# Patient Record
Sex: Female | Born: 1980
Health system: Southern US, Community
[De-identification: ages and names within clinical notes are randomized; demographics above are authoritative.]

## PROBLEM LIST (undated history)

## (undated) DIAGNOSIS — E079 Disorder of thyroid, unspecified: Secondary | ICD-10-CM

## (undated) HISTORY — PX: NO PAST SURGERIES: SHX2092

---

## 2013-02-23 ENCOUNTER — Emergency Department: Payer: Self-pay | Admitting: Emergency Medicine

## 2013-02-23 LAB — LIPASE, BLOOD: Lipase: 195 U/L (ref 73–393)

## 2013-02-23 LAB — COMPREHENSIVE METABOLIC PANEL
Albumin: 4 g/dL (ref 3.4–5.0)
Alkaline Phosphatase: 96 U/L (ref 50–136)
BUN: 12 mg/dL (ref 7–18)
Bilirubin,Total: 0.2 mg/dL (ref 0.2–1.0)
Co2: 26 mmol/L (ref 21–32)
EGFR (African American): >60
EGFR (Non-African Amer.): >60
Glucose: 110 mg/dL — ABNORMAL HIGH (ref 65–99)
Osmolality: 278 (ref 275–301)
Potassium: 3.7 mmol/L (ref 3.5–5.1)
SGOT(AST): 22 U/L (ref 15–37)
Sodium: 139 mmol/L (ref 136–145)
Total Protein: 7.4 g/dL (ref 6.4–8.2)

## 2013-02-23 LAB — URINALYSIS, COMPLETE
Bilirubin,UR: NEGATIVE
Glucose,UR: NEGATIVE mg/dL (ref 0–75)
Ketone: NEGATIVE
Nitrite: NEGATIVE
Ph: 7 (ref 4.5–8.0)
RBC,UR: 5 /HPF (ref 0–5)
Specific Gravity: 1.018 (ref 1.003–1.030)
Squamous Epithelial: 3

## 2013-02-23 LAB — CBC
HGB: 13.1 g/dL (ref 12.0–16.0)
Platelet: 282 10*3/uL (ref 150–440)
RBC: 4.56 10*6/uL (ref 3.80–5.20)
RDW: 13.6 % (ref 11.5–14.5)

## 2013-02-24 ENCOUNTER — Emergency Department: Payer: Self-pay | Admitting: Emergency Medicine

## 2013-05-13 ENCOUNTER — Ambulatory Visit: Payer: Self-pay | Admitting: Family Medicine

## 2013-05-13 LAB — CBC WITH DIFFERENTIAL/PLATELET
Basophil #: 0.1 10*3/uL (ref 0.0–0.1)
Basophil %: 0.7 %
Eosinophil %: 0.1 %
HGB: 13.8 g/dL (ref 12.0–16.0)
MCH: 27.9 pg (ref 26.0–34.0)
MCHC: 32.3 g/dL (ref 32.0–36.0)
MCV: 86 fL (ref 80–100)
Monocyte #: 0.7 x10 3/mm (ref 0.2–0.9)
Monocyte %: 4.5 %
Platelet: 278 10*3/uL (ref 150–440)
RBC: 4.93 10*6/uL (ref 3.80–5.20)
RDW: 13 % (ref 11.5–14.5)
WBC: 14.5 10*3/uL — ABNORMAL HIGH (ref 3.6–11.0)

## 2013-05-13 LAB — URINALYSIS, COMPLETE
Leukocyte Esterase: NEGATIVE
Nitrite: NEGATIVE
Specific Gravity: >=1.03 (ref 1.003–1.030)
Squamous Epithelial: >30

## 2013-07-09 ENCOUNTER — Emergency Department: Payer: Self-pay | Admitting: Emergency Medicine

## 2014-06-07 ENCOUNTER — Ambulatory Visit: Payer: Self-pay | Admitting: Physician Assistant

## 2014-06-08 ENCOUNTER — Emergency Department: Payer: Self-pay | Admitting: Emergency Medicine

## 2014-08-14 ENCOUNTER — Emergency Department: Payer: Self-pay | Admitting: Emergency Medicine

## 2014-11-03 ENCOUNTER — Encounter: Payer: Self-pay | Admitting: *Deleted

## 2014-11-03 DIAGNOSIS — Z3202 Encounter for pregnancy test, result negative: Secondary | ICD-10-CM | POA: Diagnosis not present

## 2014-11-03 DIAGNOSIS — N938 Other specified abnormal uterine and vaginal bleeding: Secondary | ICD-10-CM | POA: Diagnosis not present

## 2014-11-03 DIAGNOSIS — N939 Abnormal uterine and vaginal bleeding, unspecified: Secondary | ICD-10-CM | POA: Diagnosis present

## 2014-11-03 LAB — CBC
HCT: 41.4 % (ref 35.0–47.0)
Hemoglobin: 13.9 g/dL (ref 12.0–16.0)
MCH: 28.6 pg (ref 26.0–34.0)
MCHC: 33.5 g/dL (ref 32.0–36.0)
MCV: 85.3 fL (ref 80.0–100.0)
Platelets: 320 10*3/uL (ref 150–440)
RBC: 4.86 MIL/uL (ref 3.80–5.20)
RDW: 13.1 % (ref 11.5–14.5)
WBC: 11.4 10*3/uL — ABNORMAL HIGH (ref 3.6–11.0)

## 2014-11-03 LAB — POCT PREGNANCY, URINE: Preg Test, Ur: NEGATIVE

## 2014-11-03 NOTE — ED Notes (Addendum)
Pt reports onset of vaginal pressure and vaginal bleeding that started on 1 week ago this past Sunday.

## 2014-11-04 ENCOUNTER — Emergency Department
Admission: EM | Admit: 2014-11-04 | Discharge: 2014-11-04 | Disposition: A | Discharge status: Home / Self Care | Payer: Managed Care, Other (non HMO) | Attending: Emergency Medicine | Admitting: Emergency Medicine

## 2014-11-04 ENCOUNTER — Emergency Department: Payer: Managed Care, Other (non HMO)

## 2014-11-04 DIAGNOSIS — Z8742 Personal history of other diseases of the female genital tract: Secondary | ICD-10-CM

## 2014-11-04 DIAGNOSIS — N938 Other specified abnormal uterine and vaginal bleeding: Secondary | ICD-10-CM

## 2014-11-04 HISTORY — DX: Disorder of thyroid, unspecified: E07.9

## 2014-11-04 NOTE — ED Provider Notes (Signed)
[facility] Emergency Department Provider Note  ____________________________________________  Time seen: 2:15 AM  I have reviewed the triage vital signs and the nursing notes.   HISTORY  Chief Complaint Vaginal Bleeding      HPI Maria CavalierGidget Mckay MansFaye Mckay is a 34 y.o. female presents with vaginal bleeding 1 week with vaginal pressure. Last menstrual period 10/10/14. Patient admits to previous history of the social uterine bleeding however states that menses have been regular since therapeutic on Synthroid.     Past Medical History  Diagnosis Date  . Thyroid disease     There are no active problems to display for this patient.   History reviewed. No pertinent past surgical history.  No current outpatient prescriptions on file.  Allergies Sulfa antibiotics  No family history on file.  Social History History  Substance Use Topics  . Smoking status: Never Smoker   . Smokeless tobacco: Not on file  . Alcohol Use: No    Review of Systems  Constitutional: Negative for fever. Eyes: Negative for visual changes. ENT: Negative for sore throat. Cardiovascular: Negative for chest pain. Respiratory: Negative for shortness of breath. Gastrointestinal: Negative for abdominal pain, vomiting and diarrhea. Genitourinary: Negative for dysuria. Positive for vaginal bleeding Musculoskeletal: Negative for back pain. Skin: Negative for rash. Neurological: Negative for headaches, focal weakness or numbness.  10-point ROS otherwise negative.  ____________________________________________   PHYSICAL EXAM:  VITAL SIGNS: ED Triage Vitals  Enc Vitals Group     BP 11/03/14 2202 127/79 mmHg     Pulse Rate 11/03/14 2202 96     Resp 11/03/14 2202 20     Temp 11/03/14 2202 98.4 F (36.9 C)     Temp Source 11/03/14 2202 Oral     SpO2 11/03/14 2202 100 %     Weight 11/03/14 2202 157 lb (71.215 kg)     Height 11/03/14 2202 5\' 3"  (1.6 m)     Head Cir --    Peak Flow --      Pain Score 11/03/14 2202 9     Pain Loc --      Pain Edu? --      Excl. in GC? --     Constitutional: Alert and oriented. Well appearing and in no distress. Eyes: Conjunctivae are normal. PERRL. Normal extraocular movements. ENT   Head: Normocephalic and atraumatic.   Nose: No congestion/rhinnorhea.   Mouth/Throat: Mucous membranes are moist.   Neck: No stridor. Hematological/Lymphatic/Immunilogical: No cervical lymphadenopathy. Cardiovascular: Normal rate, regular rhythm. Normal and symmetric distal pulses are present in all extremities. No murmurs, rubs, or gallops. Respiratory: Normal respiratory effort without tachypnea nor retractions. Breath sounds are clear and equal bilaterally. No wheezes/rales/rhonchi. Gastrointestinal: Suprapubic discomfort. No distention. There is no CVA tenderness. Genitourinary: deferred Musculoskeletal: Nontender with normal range of motion in all extremities. No joint effusions.  No lower extremity tenderness nor edema. Neurologic:  Normal speech and language. No gross focal neurologic deficits are appreciated. Speech is normal.  Skin:  Skin is warm, dry and intact. No rash noted. Psychiatric: Mood and affect are normal. Speech and behavior are normal. Patient exhibits appropriate insight and judgment.  ____________________________________________    LABS (pertinent positives/negatives)  Labs Reviewed  CBC - Abnormal; Notable for the following:    WBC 11.4 (*)    All other components within normal limits  POC URINE PREG, ED  POCT PREGNANCY, URINE     ____________________________________________     RADIOLOGY unremarkable pelvic ultrasound  ____________________________________________  INITIAL IMPRESSION / ASSESSMENT AND PLAN / ED COURSE  Pertinent labs & imaging results that were available during my care of the patient were reviewed by me and considered in my medical decision making (see chart for  details).  History of physical exam consistent with dysfunctional uterine bleeding.  ____________________________________________   FINAL CLINICAL IMPRESSION(S) / ED DIAGNOSES  Final diagnoses:  DUB (dysfunctional uterine bleeding)      Darci Currentandolph N Karyn Brull, MD 11/04/14 0430

## 2014-11-04 NOTE — ED Notes (Signed)
Patient transported to ultrasound.

## 2014-11-04 NOTE — Discharge Instructions (Signed)
Dysfunctional Uterine Bleeding Normally, menstrual periods begin between ages 11 to 17 in young women. A normal menstrual cycle/period may begin every 23 days up to 35 days and lasts from 1 to 7 days. Around 12 to 14 days before your menstrual period starts, ovulation (ovary produces an egg) occurs. When counting the time between menstrual periods, count from the first day of bleeding of the previous period to the first day of bleeding of the next period. Dysfunctional (abnormal) uterine bleeding is bleeding that is different from a normal menstrual period. Your periods may come earlier or later than usual. They may be lighter, have blood clots or be heavier. You may have bleeding between periods, or you may skip one period or more. You may have bleeding after sexual intercourse, bleeding after menopause, or no menstrual period. CAUSES   Pregnancy (normal, miscarriage, tubal).  IUDs (intrauterine device, birth control).  Birth control pills.  Hormone treatment.  Menopause.  Infection of the cervix.  Blood clotting problems.  Infection of the inside lining of the uterus.  Endometriosis, inside lining of the uterus growing in the pelvis and other female organs.  Adhesions (scar tissue) inside the uterus.  Obesity or severe weight loss.  Uterine polyps inside the uterus.  Cancer of the vagina, cervix, or uterus.  Ovarian cysts or polycystic ovary syndrome.  Medical problems (diabetes, thyroid disease).  Uterine fibroids (noncancerous tumor).  Problems with your female hormones.  Endometrial hyperplasia, very thick lining and enlarged cells inside of the uterus.  Medicines that interfere with ovulation.  Radiation to the pelvis or abdomen.  Chemotherapy. DIAGNOSIS   Your doctor will discuss the history of your menstrual periods, medicines you are taking, changes in your weight, stress in your life, and any medical problems you may have.  Your doctor will do a physical  and pelvic examination.  Your doctor may want to perform certain tests to make a diagnosis, such as:  Pap test.  Blood tests.  Cultures for infection.  CT scan.  Ultrasound.  Hysteroscopy.  Laparoscopy.  MRI.  Hysterosalpingography.  D and C.  Endometrial biopsy. TREATMENT  Treatment will depend on the cause of the dysfunctional uterine bleeding (DUB). Treatment may include:  Observing your menstrual periods for a couple of months.  Prescribing medicines for medical problems, including:  Antibiotics.  Hormones.  Birth control pills.  Removing an IUD (intrauterine device, birth control).  Surgery:  D and C (scrape and remove tissue from inside the uterus).  Laparoscopy (examine inside the abdomen with a lighted tube).  Uterine ablation (destroy lining of the uterus with electrical current, laser, heat, or freezing).  Hysteroscopy (examine cervix and uterus with a lighted tube).  Hysterectomy (remove the uterus). HOME CARE INSTRUCTIONS   If medicines were prescribed, take exactly as directed. Do not change or switch medicines without consulting your caregiver.  Long term heavy bleeding may result in iron deficiency. Your caregiver may have prescribed iron pills. They help replace the iron that your body lost from heavy bleeding. Take exactly as directed.  Do not take aspirin or medicines that contain aspirin one week before or during your menstrual period. Aspirin may make the bleeding worse.  If you need to change your sanitary pad or tampon more than once every 2 hours, stay in bed with your feet elevated and a cold pack on your lower abdomen. Rest as much as possible, until the bleeding stops or slows down.  Eat well-balanced meals. Eat foods high in iron. Examples   are:  Leafy green vegetables.  Whole-grain breads and cereals.  Eggs.  Meat.  Liver.  Do not try to lose weight until the abnormal bleeding has stopped and your blood iron level is  back to normal. Do not lift more than ten pounds or do strenuous activities when you are bleeding.  For a couple of months, make note on your calendar, marking the start and ending of your period, and the type of bleeding (light, medium, heavy, spotting, clots or missed periods). This is for your caregiver to better evaluate your problem. SEEK MEDICAL CARE IF:   You develop nausea (feeling sick to your stomach) and vomiting, dizziness, or diarrhea while you are taking your medicine.  You are getting lightheaded or weak.  You have any problems that may be related to the medicine you are taking.  You develop pain with your DUB.  You want to remove your IUD.  You want to stop or change your birth control pills or hormones.  You have any type of abnormal bleeding mentioned above.  You are over 16 years old and have not had a menstrual period yet.  You are 34 years old and you are still having menstrual periods.  You have any of the symptoms mentioned above.  You develop a rash. SEEK IMMEDIATE MEDICAL CARE IF:   An oral temperature above 102 F (38.9 C) develops.  You develop chills.  You are changing your sanitary pad or tampon more than once an hour.  You develop abdominal pain.  You pass out or faint. Document Released: 06/08/2000 Document Revised: 09/03/2011 Document Reviewed: 05/10/2009 Exit[employer] Patient Information 2015 [employer]. This information is not intended to replace advice given to you by your health care provider. Make sure you discuss any questions you have with your health care provider.  

## 2014-11-04 NOTE — ED Notes (Signed)
Patient returned from Ultrasound. 

## 2014-11-04 NOTE — ED Notes (Signed)
MD Brown at bedside.

## 2014-12-13 ENCOUNTER — Ambulatory Visit
Admission: EM | Admit: 2014-12-13 | Discharge: 2014-12-13 | Disposition: A | Discharge status: Home / Self Care | Payer: Managed Care, Other (non HMO) | Attending: Family Medicine | Admitting: Family Medicine

## 2014-12-13 DIAGNOSIS — T148 Other injury of unspecified body region: Secondary | ICD-10-CM | POA: Diagnosis not present

## 2014-12-13 DIAGNOSIS — T148XXA Other injury of unspecified body region, initial encounter: Secondary | ICD-10-CM

## 2014-12-13 NOTE — ED Notes (Signed)
Patient had blood work done at [facility] today, noticed swelling and collection of blood at site of venipuncture. Patient describes as painful, hurts to bend her arm.

## 2014-12-13 NOTE — ED Provider Notes (Signed)
CSN: [id number]     Arrival date & time 12/13/14  1848 History   First MD Initiated Contact with Patient 12/13/14 1921     Chief Complaint  Patient presents with  . Bleeding/Bruising   (Consider location/radiation/quality/duration/timing/severity/associated sxs/prior Treatment) HPI Comments: 34 yo female donated blood today around 2pm and later this afternoon noticed some blood and bruising underneath the skin at the venipuncture site. Patient states it's painful. Denies external bleeding.   The history is provided by the patient.    Past Medical History  Diagnosis Date  . Thyroid disease    Past Surgical History  Procedure Laterality Date  . No past surgeries     Family History  Problem Relation Age of Onset  . Atrial fibrillation Father    History  Substance Use Topics  . Smoking status: Never Smoker   . Smokeless tobacco: Not on file  . Alcohol Use: No   OB History    No data available     Review of Systems  Allergies  Sulfa antibiotics  Home Medications   Prior to Admission medications   Medication Sig Start Date End Date Taking? Authorizing Provider  diazepam (VALIUM) 2 MG tablet Take 2 mg by mouth every 6 (six) hours as needed for anxiety.   Yes Historical Provider, MD  diazepam (VALIUM) 5 MG tablet Take 5 mg by mouth every 6 (six) hours as needed for anxiety.   Yes Historical Provider, MD  levothyroxine (SYNTHROID, LEVOTHROID) 50 MCG tablet Take 50 mcg by mouth daily before breakfast.   Yes Historical Provider, MD  levothyroxine (SYNTHROID, LEVOTHROID) 75 MCG tablet Take 75 mcg by mouth daily before breakfast.   Yes Historical Provider, MD   BP 125/76 mmHg  Pulse 93  Resp 18  Ht 5\' 3"  (1.6 m)  Wt 160 lb (72.576 kg)  BMI 28.35 kg/m2  SpO2 100%  LMP 11/22/2014 Physical Exam  Constitutional: She appears well-developed and well-nourished. No distress.  Skin: She is not diaphoretic.  Left antecubital skin area with small, 1.5cm superficial, hematoma. No  active bleeding. Right upper extremity neurovascularly intact. Normal range of motion.   Nursing note and vitals reviewed.   ED Course  Procedures (including critical care time) Labs Review Labs Reviewed - No data to display  Imaging Review No results found.   MDM   1. Hematoma   (superficial; mild; left antecubital area)  Plan: 1. diagnosis reviewed with patient 2. Recommend supportive treatment with rest, ice, elevation, otc analgesics 3. F/u prn if symptoms worsen or don't improve   Payton Mccallum, MD 12/13/14 [phone]

## 2015-03-03 ENCOUNTER — Emergency Department
Admission: EM | Admit: 2015-03-03 | Discharge: 2015-03-03 | Disposition: A | Discharge status: Home / Self Care | Payer: Managed Care, Other (non HMO) | Attending: Emergency Medicine | Admitting: Emergency Medicine

## 2015-03-03 ENCOUNTER — Encounter: Payer: Self-pay | Admitting: Emergency Medicine

## 2015-03-03 DIAGNOSIS — R1110 Vomiting, unspecified: Secondary | ICD-10-CM | POA: Diagnosis present

## 2015-03-03 DIAGNOSIS — N3090 Cystitis, unspecified without hematuria: Secondary | ICD-10-CM | POA: Insufficient documentation

## 2015-03-03 DIAGNOSIS — K529 Noninfective gastroenteritis and colitis, unspecified: Secondary | ICD-10-CM | POA: Diagnosis not present

## 2015-03-03 LAB — URINALYSIS COMPLETE WITH MICROSCOPIC (ARMC ONLY)
GLUCOSE, UA: NEGATIVE mg/dL
Leukocytes, UA: NEGATIVE
Nitrite: NEGATIVE
Protein, ur: 100 mg/dL — AB
Specific Gravity, Urine: 1.041 — ABNORMAL HIGH (ref 1.005–1.030)
pH: 5 (ref 5.0–8.0)

## 2015-03-03 LAB — COMPREHENSIVE METABOLIC PANEL
ALBUMIN: 5 g/dL (ref 3.5–5.0)
ALT: 73 U/L — AB (ref 14–54)
AST: 43 U/L — ABNORMAL HIGH (ref 15–41)
Alkaline Phosphatase: 111 U/L (ref 38–126)
Anion gap: 11 (ref 5–15)
BILIRUBIN TOTAL: 0.6 mg/dL (ref 0.3–1.2)
BUN: 12 mg/dL (ref 6–20)
CHLORIDE: 105 mmol/L (ref 101–111)
CO2: 24 mmol/L (ref 22–32)
CREATININE: 0.77 mg/dL (ref 0.44–1.00)
Calcium: 9.8 mg/dL (ref 8.9–10.3)
GFR calc Af Amer: >60 mL/min (ref >60)
GLUCOSE: 114 mg/dL — AB (ref 65–99)
POTASSIUM: 3.6 mmol/L (ref 3.5–5.1)
Sodium: 140 mmol/L (ref 135–145)
Total Protein: 8.4 g/dL — ABNORMAL HIGH (ref 6.5–8.1)

## 2015-03-03 LAB — CBC
HCT: 44.1 % (ref 35.0–47.0)
Hemoglobin: 14.6 g/dL (ref 12.0–16.0)
MCH: 27.8 pg (ref 26.0–34.0)
MCHC: 33 g/dL (ref 32.0–36.0)
MCV: 84.2 fL (ref 80.0–100.0)
PLATELETS: 348 10*3/uL (ref 150–440)
RBC: 5.24 MIL/uL — ABNORMAL HIGH (ref 3.80–5.20)
RDW: 13.4 % (ref 11.5–14.5)
WBC: 13.8 10*3/uL — ABNORMAL HIGH (ref 3.6–11.0)

## 2015-03-03 LAB — LIPASE, BLOOD: Lipase: 22 U/L (ref 22–51)

## 2015-03-03 MED ORDER — ONDANSETRON HCL 4 MG/2ML IJ SOLN
4.0000 mg | Freq: Once | INTRAMUSCULAR | Status: AC
  Administered 2015-03-03: 4 mg via INTRAVENOUS
  Filled 2015-03-03: qty 2

## 2015-03-03 MED ORDER — ONDANSETRON HCL 4 MG PO TABS: 4.0000 mg | ORAL_TABLET | Freq: Every day | ORAL | Status: DC | PRN

## 2015-03-03 MED ORDER — ONDANSETRON 4 MG PO TBDP
4.0000 mg | ORAL_TABLET | Freq: Once | ORAL | Status: AC | PRN
  Administered 2015-03-03: 4 mg via ORAL

## 2015-03-03 MED ORDER — ONDANSETRON 4 MG PO TBDP
ORAL_TABLET | ORAL | Status: AC
  Filled 2015-03-03: qty 1

## 2015-03-03 MED ORDER — SODIUM CHLORIDE 0.9 % IV SOLN
Freq: Once | INTRAVENOUS | Status: AC
  Administered 2015-03-03: 1000 mL via INTRAVENOUS

## 2015-03-03 MED ORDER — KETOROLAC TROMETHAMINE 30 MG/ML IJ SOLN
30.0000 mg | Freq: Once | INTRAMUSCULAR | Status: AC
  Administered 2015-03-03: 30 mg via INTRAVENOUS
  Filled 2015-03-03: qty 1

## 2015-03-03 NOTE — Discharge Instructions (Signed)

## 2015-03-03 NOTE — ED Provider Notes (Signed)
[facility] Emergency Department Provider Note     Time seen: ----------------------------------------- 2:22 PM on 03/03/2015 -----------------------------------------    I have reviewed the triage vital signs and the nursing notes.   HISTORY  Chief Complaint Emesis and Diarrhea    HPI Timisha Labrenda Lasky is a 34 y.o. female who presents ER with sudden onset of vomiting and diarrhea about 2 AM this morning. Patient started getting nauseated and feeling poorly last night, she then vomited all night according to report. She is not complaining of any abdominal pain, started having facial pressure after the vomiting, concern for sinusitis.   Past Medical History  Diagnosis Date  . Thyroid disease     There are no active problems to display for this patient.   Past Surgical History  Procedure Laterality Date  . No past surgeries      Allergies Sulfa antibiotics  Social History Social History  Substance Use Topics  . Smoking status: Never Smoker   . Smokeless tobacco: None  . Alcohol Use: No    Review of Systems Constitutional: Negative for fever. Eyes: Negative for visual changes. ENT: Negative for sore throat. Positive for sinus pressure Cardiovascular: Negative for chest pain. Respiratory: Negative for shortness of breath. Gastrointestinal: Negative for abdominal pain, positive for vomiting and diarrhea Genitourinary: Negative for dysuria. Musculoskeletal: Negative for back pain. Skin: Negative for rash. Neurological: Negative for headaches, focal weakness or numbness.  10-point ROS otherwise negative.  ____________________________________________   PHYSICAL EXAM:  VITAL SIGNS: ED Triage Vitals  Enc Vitals Group     BP 03/03/15 1029 131/91 mmHg     Pulse Rate 03/03/15 1029 101     Resp 03/03/15 1029 20     Temp 03/03/15 1029 97.5 F (36.4 C)     Temp Source 03/03/15 1029 Oral     SpO2 03/03/15 1029 97 %     Weight  03/03/15 1029 150 lb (68.04 kg)     Height 03/03/15 1029  (1.6 m)     Head Cir --      Peak Flow --      Pain Score --      Pain Loc --      Pain Edu? --      Excl. in GC? --     Constitutional: Alert and oriented. Well appearing and in no distress. Eyes: Conjunctivae are normal. PERRL. Normal extraocular movements. ENT   Head: Normocephalic and atraumatic.   Nose: No congestion/rhinnorhea.   Mouth/Throat: Mucous membranes are moist.   Neck: No stridor. Cardiovascular: Normal rate, regular rhythm. Normal and symmetric distal pulses are present in all extremities. No murmurs, rubs, or gallops. Respiratory: Normal respiratory effort without tachypnea nor retractions. Breath sounds are clear and equal bilaterally. No wheezes/rales/rhonchi. Gastrointestinal: Soft and nontender. No distention. No abdominal bruits.  Musculoskeletal: Nontender with normal range of motion in all extremities. No joint effusions.  No lower extremity tenderness nor edema. Neurologic:  Normal speech and language. No gross focal neurologic deficits are appreciated. Speech is normal. No gait instability. Skin:  Skin is warm, dry and intact. No rash noted. Psychiatric: Mood and affect are normal. Speech and behavior are normal. Patient exhibits appropriate insight and judgment. ____________________________________________  ED COURSE:  Pertinent labs & imaging results that were available during my care of the patient were reviewed by me and considered in my medical decision making (see chart for details). Patient is in no acute distress, benign exam. Will check basic labs give IV  fluid and antiemetics ____________________________________________    LABS (pertinent positives/negatives)  Labs Reviewed  COMPREHENSIVE METABOLIC PANEL - Abnormal; Notable for the following:    Glucose, Bld 114 (*)    Total Protein 8.4 (*)    AST 43 (*)    ALT 73 (*)    All other components within normal limits   CBC - Abnormal; Notable for the following:    WBC 13.8 (*)    RBC 5.24 (*)    All other components within normal limits  URINALYSIS COMPLETEWITH MICROSCOPIC ([facility] ONLY) - Abnormal; Notable for the following:    Color, Urine AMBER (*)    APPearance CLOUDY (*)    Bilirubin Urine 2+ (*)    Ketones, ur TRACE (*)    Specific Gravity, Urine 1.041 (*)    Hgb urine dipstick 1+ (*)    Protein, ur 100 (*)    Bacteria, UA RARE (*)    Squamous Epithelial / LPF TOO NUMEROUS TO COUNT (*)    All other components within normal limits  LIPASE, BLOOD   ____________________________________________  FINAL ASSESSMENT AND PLAN   Gastroenteritis, cystitis  Plan: Patient with labs and imaging as dictated above. Mild leukocytosis and mild transaminitis. This is likely gastroenteritis related. Patient also appears dehydrated, received IV fluid and then time medics in the ER. There is a poor urine specimen, likely UTI however. Patient will receive a short course of antibiotics for same. Should be advised to follow-up in 2 days for recheck.   Emily Filbert, MD   Emily Filbert, MD 03/03/15 [phone]

## 2015-03-03 NOTE — ED Notes (Signed)
Patient to ED with sudden onset vomiting at 2am, couple episodes of diarrrhea.

## 2016-08-07 ENCOUNTER — Other Ambulatory Visit: Payer: Self-pay | Admitting: Nurse Practitioner

## 2016-08-07 DIAGNOSIS — Z1231 Encounter for screening mammogram for malignant neoplasm of breast: Secondary | ICD-10-CM

## 2016-09-06 ENCOUNTER — Ambulatory Visit
Admission: RE | Admit: 2016-09-06 | Discharge: 2016-09-06 | Disposition: A | Discharge status: Home / Self Care | Payer: 59 | Source: Ambulatory Visit | Attending: Nurse Practitioner | Admitting: Nurse Practitioner

## 2016-09-06 DIAGNOSIS — Z1231 Encounter for screening mammogram for malignant neoplasm of breast: Secondary | ICD-10-CM | POA: Insufficient documentation

## 2016-09-12 ENCOUNTER — Other Ambulatory Visit: Payer: Self-pay | Admitting: Nurse Practitioner

## 2016-09-12 DIAGNOSIS — N6489 Other specified disorders of breast: Secondary | ICD-10-CM

## 2016-09-12 DIAGNOSIS — R928 Other abnormal and inconclusive findings on diagnostic imaging of breast: Secondary | ICD-10-CM

## 2016-09-24 ENCOUNTER — Ambulatory Visit
Admission: RE | Admit: 2016-09-24 | Discharge: 2016-09-24 | Disposition: A | Discharge status: Home / Self Care | Payer: 59 | Source: Ambulatory Visit | Attending: Nurse Practitioner | Admitting: Nurse Practitioner

## 2016-09-24 DIAGNOSIS — R928 Other abnormal and inconclusive findings on diagnostic imaging of breast: Secondary | ICD-10-CM

## 2016-09-24 DIAGNOSIS — N6489 Other specified disorders of breast: Secondary | ICD-10-CM

## 2016-09-24 DIAGNOSIS — N6324 Unspecified lump in the left breast, lower inner quadrant: Secondary | ICD-10-CM | POA: Diagnosis not present

## 2017-04-15 ENCOUNTER — Other Ambulatory Visit: Payer: Self-pay | Admitting: Nurse Practitioner

## 2017-04-16 ENCOUNTER — Other Ambulatory Visit: Payer: Self-pay | Admitting: Nurse Practitioner

## 2017-04-16 DIAGNOSIS — N63 Unspecified lump in unspecified breast: Secondary | ICD-10-CM

## 2017-05-15 ENCOUNTER — Other Ambulatory Visit: Payer: Managed Care, Other (non HMO)

## 2017-05-15 ENCOUNTER — Inpatient Hospital Stay: Admission: RE | Admit: 2017-05-15 | Payer: Managed Care, Other (non HMO) | Source: Ambulatory Visit

## 2017-06-26 ENCOUNTER — Ambulatory Visit
Admission: RE | Admit: 2017-06-26 | Discharge: 2017-06-26 | Disposition: A | Discharge status: Home / Self Care | Payer: 59 | Source: Ambulatory Visit | Attending: Nurse Practitioner | Admitting: Nurse Practitioner

## 2017-06-26 DIAGNOSIS — N63 Unspecified lump in unspecified breast: Secondary | ICD-10-CM

## 2017-06-26 DIAGNOSIS — N6323 Unspecified lump in the left breast, lower outer quadrant: Secondary | ICD-10-CM | POA: Insufficient documentation

## 2017-06-28 ENCOUNTER — Other Ambulatory Visit: Payer: Self-pay | Admitting: Nurse Practitioner

## 2017-06-28 DIAGNOSIS — N6320 Unspecified lump in the left breast, unspecified quadrant: Secondary | ICD-10-CM

## 2017-06-28 DIAGNOSIS — R928 Other abnormal and inconclusive findings on diagnostic imaging of breast: Secondary | ICD-10-CM

## 2017-07-08 ENCOUNTER — Ambulatory Visit
Admission: RE | Admit: 2017-07-08 | Discharge: 2017-07-08 | Disposition: A | Discharge status: Home / Self Care | Payer: 59 | Source: Ambulatory Visit | Attending: Nurse Practitioner | Admitting: Nurse Practitioner

## 2017-07-08 DIAGNOSIS — N6082 Other benign mammary dysplasias of left breast: Secondary | ICD-10-CM | POA: Diagnosis not present

## 2017-07-08 DIAGNOSIS — R928 Other abnormal and inconclusive findings on diagnostic imaging of breast: Secondary | ICD-10-CM

## 2017-07-08 DIAGNOSIS — N6012 Diffuse cystic mastopathy of left breast: Secondary | ICD-10-CM | POA: Insufficient documentation

## 2017-07-08 DIAGNOSIS — N6324 Unspecified lump in the left breast, lower inner quadrant: Secondary | ICD-10-CM | POA: Diagnosis not present

## 2017-07-08 DIAGNOSIS — N6320 Unspecified lump in the left breast, unspecified quadrant: Secondary | ICD-10-CM

## 2017-07-08 HISTORY — PX: BREAST BIOPSY: SHX20

## 2017-07-09 LAB — SURGICAL PATHOLOGY

## 2017-08-19 ENCOUNTER — Other Ambulatory Visit: Payer: Self-pay | Admitting: Nurse Practitioner

## 2017-08-22 LAB — CBC
HEMOGLOBIN: 14.5 g/dL (ref 11.1–15.9)
Hematocrit: 41.5 % (ref 34.0–46.6)
MCH: 29.1 pg (ref 26.6–33.0)
MCHC: 34.9 g/dL (ref 31.5–35.7)
MCV: 83 fL (ref 79–97)
PLATELETS: 333 10*3/uL (ref 150–379)
RBC: 4.99 x10E6/uL (ref 3.77–5.28)
RDW: 13.1 % (ref 12.3–15.4)
WBC: 11 10*3/uL — AB (ref 3.4–10.8)

## 2017-08-22 LAB — LIPID PANEL W/O CHOL/HDL RATIO
CHOLESTEROL TOTAL: 181 mg/dL (ref 100–199)
HDL: 40 mg/dL (ref >39)
LDL CALC: 121 mg/dL — AB (ref 0–99)
Triglycerides: 101 mg/dL (ref 0–149)
VLDL CHOLESTEROL CAL: 20 mg/dL (ref 5–40)

## 2017-08-22 LAB — COMPREHENSIVE METABOLIC PANEL
ALK PHOS: 105 IU/L (ref 39–117)
ALT: 114 IU/L — AB (ref 0–32)
AST: 45 IU/L — AB (ref 0–40)
Albumin/Globulin Ratio: 2 (ref 1.2–2.2)
Albumin: 4.9 g/dL (ref 3.5–5.5)
BUN/Creatinine Ratio: 17 (ref 9–23)
BUN: 13 mg/dL (ref 6–20)
Bilirubin Total: 0.4 mg/dL (ref 0.0–1.2)
CALCIUM: 9.7 mg/dL (ref 8.7–10.2)
CO2: 21 mmol/L (ref 20–29)
CREATININE: 0.78 mg/dL (ref 0.57–1.00)
Chloride: 103 mmol/L (ref 96–106)
GFR calc Af Amer: 112 mL/min/{1.73_m2} (ref >59)
GFR, EST NON AFRICAN AMERICAN: 97 mL/min/{1.73_m2} (ref >59)
Globulin, Total: 2.4 g/dL (ref 1.5–4.5)
Glucose: 102 mg/dL — ABNORMAL HIGH (ref 65–99)
POTASSIUM: 4.1 mmol/L (ref 3.5–5.2)
SODIUM: 143 mmol/L (ref 134–144)
Total Protein: 7.3 g/dL (ref 6.0–8.5)

## 2017-08-22 LAB — VITAMIN D 25 HYDROXY (VIT D DEFICIENCY, FRACTURES): VIT D 25 HYDROXY: 20.3 ng/mL — AB (ref 30.0–100.0)

## 2017-08-22 LAB — TSH: TSH: 3.8 u[IU]/mL (ref 0.450–4.500)

## 2017-08-22 LAB — T4, FREE: Free T4: 1.55 ng/dL (ref 0.82–1.77)

## 2017-08-26 ENCOUNTER — Encounter: Payer: Self-pay | Admitting: Nurse Practitioner

## 2017-08-26 ENCOUNTER — Ambulatory Visit (INDEPENDENT_AMBULATORY_CARE_PROVIDER_SITE_OTHER): Payer: 59 | Admitting: Nurse Practitioner

## 2017-08-26 VITALS — BP 130/80 | HR 69 | Resp 16 | Ht 63.0 in | Wt 144.0 lb

## 2017-08-26 DIAGNOSIS — E079 Disorder of thyroid, unspecified: Secondary | ICD-10-CM

## 2017-08-26 DIAGNOSIS — R300 Dysuria: Secondary | ICD-10-CM

## 2017-08-26 DIAGNOSIS — R110 Nausea: Secondary | ICD-10-CM | POA: Diagnosis not present

## 2017-08-26 DIAGNOSIS — R945 Abnormal results of liver function studies: Secondary | ICD-10-CM

## 2017-08-26 DIAGNOSIS — R5383 Other fatigue: Secondary | ICD-10-CM

## 2017-08-26 DIAGNOSIS — D72829 Elevated white blood cell count, unspecified: Secondary | ICD-10-CM | POA: Diagnosis not present

## 2017-08-26 DIAGNOSIS — R7989 Other specified abnormal findings of blood chemistry: Secondary | ICD-10-CM | POA: Insufficient documentation

## 2017-08-26 DIAGNOSIS — Z0001 Encounter for general adult medical examination with abnormal findings: Secondary | ICD-10-CM

## 2017-08-26 MED ORDER — AMOXICILLIN 875 MG PO TABS: 875.0000 mg | ORAL_TABLET | Freq: Two times a day (BID) | ORAL | 0 refills | Status: DC

## 2017-08-26 NOTE — Progress Notes (Signed)
[facility] [address] [place], [place] [address]  Internal MEDICINE  Office Visit Note  Patient Name: Maria Mckay  [id number]  [id number]  Date of Service: 08/26/2017  Chief Complaint  Patient presents with  . Fatigue     The patient is here for routine health maintenance exam. Today, she is complaining of severe fatigue. After sleeping at night she is unrefreshed. Taking a nap makes the fatigue worse. She also reports an increase in symptoms associated with IBS. She states that some days, eating certain foods or taking certain medications will make her feel bad. Other days, this is not so bad.  She did have lab work done recently. This indicated decreased vitamin d levels. She also had elevated WBC and elevated liver enzymes.   Pt is here for routine health maintenance examination  Current Medication: Outpatient Encounter Medications as of 08/26/2017  Medication Sig  . diazepam (VALIUM) 2 MG tablet Take 2 mg by mouth every 6 (six) hours as needed for anxiety.  Marland Kitchen levothyroxine (SYNTHROID, LEVOTHROID) 88 MCG tablet TAKE ONE TABLET BY MOUTH EVERY MORNING ON EMPTY STOMACH  . promethazine (PHENERGAN) 25 MG tablet TAKE 1/2 TO 1 TABLET BY MOUTH TWICE DAILY AS NEEDED FOR NAUSEA  . [DISCONTINUED] levothyroxine (SYNTHROID, LEVOTHROID) 75 MCG tablet Take 75 mcg by mouth daily before breakfast.  . amoxicillin (AMOXIL) 875 MG tablet Take 1 tablet (875 mg total) by mouth 2 (two) times daily.  . diazepam (VALIUM) 5 MG tablet Take 5 mg by mouth every 6 (six) hours as needed for anxiety.  . ondansetron (ZOFRAN) 4 MG tablet Take 1 tablet (4 mg total) by mouth daily as needed for nausea or vomiting. (Patient not taking: Reported on 08/26/2017)  . [DISCONTINUED] levothyroxine (SYNTHROID, LEVOTHROID) 50 MCG tablet Take 50 mcg by mouth daily before breakfast.   No facility-administered encounter medications on file as of 08/26/2017.     Surgical History: Past Surgical History:    Procedure Laterality Date  . BREAST BIOPSY Left 07/08/2017  . NO PAST SURGERIES      Medical History: Past Medical History:  Diagnosis Date  . Thyroid disease     Family History: Family History  Problem Relation Age of Onset  . Breast cancer Mother 21  . Atrial fibrillation Father   . Breast cancer Maternal Aunt       Review of Systems  Constitutional: Positive for activity change and fatigue. Negative for chills and unexpected weight change.  HENT: Negative for congestion, postnasal drip, rhinorrhea, sneezing and sore throat.   Eyes: Negative.  Negative for redness.  Respiratory: Negative for cough, chest tightness and shortness of breath.   Cardiovascular: Negative for chest pain and palpitations.  Gastrointestinal: Positive for nausea. Negative for abdominal pain, constipation, diarrhea and vomiting.  Endocrine:       Well controlled thyroid disease.  Genitourinary: Negative for dysuria and frequency.       Last menstrual cycle was 07/29/2017  Musculoskeletal: Positive for arthralgias and back pain. Negative for gait problem, joint swelling and neck pain.  Skin: Negative for rash.       Generalized eczema  Neurological: Positive for headaches. Negative for tremors and numbness.  Hematological: Negative for adenopathy. Does not bruise/bleed easily.  Psychiatric/Behavioral: Positive for sleep disturbance. Negative for behavioral problems (Depression) and suicidal ideas. The patient is nervous/anxious.      Today's Vitals   08/26/17 1045  BP: 130/80  Pulse: 69  Resp: 16  SpO2: 99%  Weight: 144  lb (65.3 kg)  Height: 5\' 3"  (1.6 m)    Physical Exam  Constitutional: She is oriented to person, place, and time. She appears well-developed and well-nourished. No distress.  HENT:  Head: Normocephalic and atraumatic.  Mouth/Throat: Oropharynx is clear and moist. No oropharyngeal exudate.  Eyes: EOM are normal. Pupils are equal, round, and reactive to light.  Neck:  Normal range of motion. Neck supple. No JVD present. No tracheal deviation present. No thyromegaly present.  Cardiovascular: Normal rate, regular rhythm, normal heart sounds and intact distal pulses. Exam reveals no gallop and no friction rub.  No murmur heard. Pulmonary/Chest: Effort normal and breath sounds normal. No respiratory distress. She has no wheezes. She has no rales. She exhibits no tenderness. Right breast exhibits no inverted nipple, no mass, no nipple discharge, no skin change and no tenderness. Left breast exhibits no inverted nipple, no mass, no nipple discharge, no skin change and no tenderness.    Abdominal: Soft. Bowel sounds are normal. There is no tenderness.  Musculoskeletal: Normal range of motion.  Lymphadenopathy:    She has no cervical adenopathy.  Neurological: She is alert and oriented to person, place, and time. No cranial nerve deficit.  Skin: Skin is warm and dry. She is not diaphoretic.  Psychiatric: She has a normal mood and affect. Her behavior is normal. Judgment and thought content normal.  Nursing note and vitals reviewed.    LABS: Recent Results (from the past 2160 hour(s))  Surgical pathology     Status: None   Collection Time: 07/08/17  8:47 AM  Result Value Ref Range   SURGICAL PATHOLOGY      Surgical Pathology CASE: [id number] PATIENT: Maria Mckay Surgical Pathology Report     SPECIMEN SUBMITTED: A. Breast, left  CLINICAL HISTORY: Group of small masses  PRE-OPERATIVE DIAGNOSIS: Suspect cysts  POST-OPERATIVE DIAGNOSIS: None provided.     DIAGNOSIS: A.  LEFT BREAST, 3:30, 1 CMFN; ULTRASOUND-GUIDED CORE BIOPSY: - FIBROCYSTIC CHANGE. - COLUMNAR CELL CHANGE. - PSEUDO-ANGIOMATOUS STROMAL HYPERPLASIA.   GROSS DESCRIPTION:  A. The specimen is received in a formalin-filled container labeled with the patient's name and left breast 3:30, 1 cm from nipple.  Core pieces: multiple, aggregate 1.5 x 0.6 x 0.1 cm Comments:  yellow-red lobulated fibrofatty, marked blue  Entirely submitted in cassette(s): 1  Time/Date in fixative: placed in formalin 8:27 AM on 07/08/2017 cold ischemic time of less than 1 minute Total fixation time: 8.5 hours   Final Diagnosis performed by Glenice Bowana Baker, MD.  Electronically signed 07/09/2017 10:49:21AM    The e lectronic signature indicates that the named Attending Pathologist has evaluated the specimen  Technical component performed at [facility], [address], [place], [place] [address] Lab: [phone] Dir: Jolene SchimkeSanjai Nagendra, MD, MMM  Professional component performed at [facility], [facility], [address], [place], [place] [address] Lab: [phone] Dir: Georgiann Cockerara C. Rubinas, MD    Comprehensive metabolic panel     Status: Abnormal   Collection Time: 08/19/17  8:17 AM  Result Value Ref Range   Glucose 102 (H) 65 - 99 mg/dL   BUN 13 6 - 20 mg/dL   Creatinine, Ser 5.780.78 0.57 - 1.00 mg/dL   GFR calc non Af Amer 97 >59 mL/min/1.73   GFR calc Af Amer 112 >59 mL/min/1.73   BUN/Creatinine Ratio 17 9 - 23   Sodium 143 134 - 144 mmol/L   Potassium 4.1 3.5 - 5.2 mmol/L   Chloride 103 96 - 106 mmol/L   CO2 21  20 - 29 mmol/L   Calcium 9.7 8.7 - 10.2 mg/dL   Total Protein 7.3 6.0 - 8.5 g/dL   Albumin 4.9 3.5 - 5.5 g/dL   Globulin, Total 2.4 1.5 - 4.5 g/dL   Albumin/Globulin Ratio 2.0 1.2 - 2.2   Bilirubin Total 0.4 0.0 - 1.2 mg/dL   Alkaline Phosphatase 105 39 - 117 IU/L   AST 45 (H) 0 - 40 IU/L   ALT 114 (H) 0 - 32 IU/L  CBC     Status: Abnormal   Collection Time: 08/19/17  8:17 AM  Result Value Ref Range   WBC 11.0 (H) 3.4 - 10.8 x10E3/uL   RBC 4.99 3.77 - 5.28 x10E6/uL   Hemoglobin 14.5 11.1 - 15.9 g/dL   Hematocrit 16.1 09.6 - 46.6 %   MCV 83 79 - 97 fL   MCH 29.1 26.6 - 33.0 pg   MCHC 34.9 31.5 - 35.7 g/dL   RDW 04.5 40.9 - 81.1 %   Platelets 333 150 - 379 x10E3/uL  Lipid Panel w/o Chol/HDL Ratio     Status: Abnormal   Collection Time:  08/19/17  8:17 AM  Result Value Ref Range   Cholesterol, Total 181 100 - 199 mg/dL   Triglycerides 914 0 - 149 mg/dL   HDL 40 >78 mg/dL   VLDL Cholesterol Cal 20 5 - 40 mg/dL   LDL Calculated 295 (H) 0 - 99 mg/dL  T4, free     Status: None   Collection Time: 08/19/17  8:17 AM  Result Value Ref Range   Free T4 1.55 0.82 - 1.77 ng/dL  TSH     Status: None   Collection Time: 08/19/17  8:17 AM  Result Value Ref Range   TSH 3.800 0.450 - 4.500 uIU/mL  VITAMIN D 25 Hydroxy (Vit-D Deficiency, Fractures)     Status: Abnormal   Collection Time: 08/19/17  8:17 AM  Result Value Ref Range   Vit D, 25-Hydroxy 20.3 (L) 30.0 - 100.0 ng/mL    Comment: Vitamin D deficiency has been defined by the Institute of Medicine and an Endocrine Society practice guideline as a level of serum 25-OH vitamin D less than 20 ng/mL (1,2). The Endocrine Society went on to further define vitamin D insufficiency as a level between 21 and 29 ng/mL (2). 1. IOM (Institute of Medicine). 2010. Dietary reference    intakes for calcium and D. [place]: The    [employer]. 2. Holick MF, Binkley [place], Bischoff-Ferrari HA, et al.    Evaluation, treatment, and prevention of vitamin D    deficiency: an Endocrine Society clinical practice    guideline. JCEM. 2011 Jul; 96(7):1911-30.      Assessment/Plan:  1. Encounter for general adult medical examination with abnormal findings Annual wellness visit today  2. Leukocytosis, unspecified type Will treat with amoxicillin 875mg  bid for 10 days and recheck CBC - amoxicillin (AMOXIL) 875 MG tablet; Take 1 tablet (875 mg total) by mouth 2 (two) times daily.  Dispense: 20 tablet; Refill: 0  3. Other fatigue Reviewed labs. Infection may be causing fatigue. Treat with amoxicillin.   4. Abnormal liver function test - US Abdomen Complete; Future Recheck CMP and acute hepatitis panel for further evaluation.   5. Thyroid disease Thyroid panel stable. Continue  levothyroxine as prescribed.   6. Dysuria - Urinalysis, Routine w reflex microscopic   General Counseling: Carlesha verbalizes understanding of the findings of todays visit and agrees with plan of treatment. I have discussed  any further diagnostic evaluation that may be needed or ordered today. We also reviewed her medications today. she has been encouraged to call the office with any questions or concerns that should arise related to todays visit.   This patient was seen by Vincent Gros, FNP- C in Collaboration with Dr Lyndon Code as a part of collaborative care agreement    Orders Placed This Encounter  Procedures  . US Abdomen Complete    Meds ordered this encounter  Medications  . amoxicillin (AMOXIL) 875 MG tablet    Sig: Take 1 tablet (875 mg total) by mouth 2 (two) times daily.    Dispense:  20 tablet    Refill:  0    Order Specific Question:   Supervising Provider    Answer:   Lyndon Code [[id number]]    Time spent: 62 Minutes      Lyndon Code, MD  Internal Medicine

## 2017-08-27 LAB — MICROSCOPIC EXAMINATION: Casts: NONE SEEN /lpf

## 2017-08-27 LAB — URINALYSIS, ROUTINE W REFLEX MICROSCOPIC
BILIRUBIN UA: NEGATIVE
GLUCOSE, UA: NEGATIVE
KETONES UA: NEGATIVE
Leukocytes, UA: NEGATIVE
Nitrite, UA: NEGATIVE
PH UA: 5.5 (ref 5.0–7.5)
PROTEIN UA: NEGATIVE
Specific Gravity, UA: 1.017 (ref 1.005–1.030)
UUROB: 0.2 mg/dL (ref 0.2–1.0)

## 2017-09-16 ENCOUNTER — Other Ambulatory Visit: Payer: Self-pay

## 2017-09-27 ENCOUNTER — Other Ambulatory Visit: Payer: Self-pay | Admitting: Nurse Practitioner

## 2017-09-27 ENCOUNTER — Ambulatory Visit (INDEPENDENT_AMBULATORY_CARE_PROVIDER_SITE_OTHER): Payer: 59

## 2017-09-27 DIAGNOSIS — R7989 Other specified abnormal findings of blood chemistry: Secondary | ICD-10-CM

## 2017-09-27 DIAGNOSIS — R945 Abnormal results of liver function studies: Secondary | ICD-10-CM

## 2017-09-28 LAB — CBC
Hematocrit: 41 % (ref 34.0–46.6)
Hemoglobin: 13.8 g/dL (ref 11.1–15.9)
MCH: 28.2 pg (ref 26.6–33.0)
MCHC: 33.7 g/dL (ref 31.5–35.7)
MCV: 84 fL (ref 79–97)
PLATELETS: 358 10*3/uL (ref 150–379)
RBC: 4.89 x10E6/uL (ref 3.77–5.28)
RDW: 13.5 % (ref 12.3–15.4)
WBC: 8.4 10*3/uL (ref 3.4–10.8)

## 2017-09-28 LAB — COMPREHENSIVE METABOLIC PANEL
ALBUMIN: 4.6 g/dL (ref 3.5–5.5)
ALT: 133 IU/L — ABNORMAL HIGH (ref 0–32)
AST: 60 IU/L — ABNORMAL HIGH (ref 0–40)
Albumin/Globulin Ratio: 2 (ref 1.2–2.2)
Alkaline Phosphatase: 102 IU/L (ref 39–117)
BUN / CREAT RATIO: 16 (ref 9–23)
BUN: 12 mg/dL (ref 6–20)
Bilirubin Total: 0.5 mg/dL (ref 0.0–1.2)
CALCIUM: 9.9 mg/dL (ref 8.7–10.2)
CO2: 24 mmol/L (ref 20–29)
Chloride: 105 mmol/L (ref 96–106)
Creatinine, Ser: 0.76 mg/dL (ref 0.57–1.00)
GFR calc Af Amer: 116 mL/min/{1.73_m2} (ref >59)
GFR, EST NON AFRICAN AMERICAN: 101 mL/min/{1.73_m2} (ref >59)
GLOBULIN, TOTAL: 2.3 g/dL (ref 1.5–4.5)
Glucose: 104 mg/dL — ABNORMAL HIGH (ref 65–99)
Potassium: 4.4 mmol/L (ref 3.5–5.2)
SODIUM: 143 mmol/L (ref 134–144)
Total Protein: 6.9 g/dL (ref 6.0–8.5)

## 2017-09-28 LAB — HEPATITIS PANEL, ACUTE
Hep A IgM: NEGATIVE
Hep B C IgM: NEGATIVE
Hep C Virus Ab: <0.1 s/co ratio (ref 0.0–0.9)
Hepatitis B Surface Ag: NEGATIVE

## 2017-10-01 ENCOUNTER — Ambulatory Visit: Payer: Self-pay | Admitting: Nurse Practitioner

## 2017-10-02 NOTE — Progress Notes (Signed)
Hey. I have her labs and ultrasound report back. She needs referral to Gi. Can I see her back before 10/29/2017. thanks

## 2017-10-02 NOTE — Progress Notes (Signed)
That would be awesome. Her labs still show elevated liver functions, however hepatitis panel was negative. Ultrasound showing a little sludge in the gallbladder, but was otherwise not showing anything acute with the liver. I want to refer to GI for further evaluation to figure out what is going on. Thanks.

## 2017-10-08 ENCOUNTER — Telehealth: Payer: Self-pay

## 2017-10-08 NOTE — Telephone Encounter (Signed)
Left message advising pt of us and lab results and will refer pt to GI for further evaluation/Maria Mckay

## 2017-10-29 ENCOUNTER — Ambulatory Visit: Payer: 59 | Admitting: Nurse Practitioner

## 2017-10-29 ENCOUNTER — Encounter: Payer: Self-pay | Admitting: Nurse Practitioner

## 2017-10-29 VITALS — BP 137/78 | HR 85 | Resp 16 | Ht 63.0 in | Wt 151.0 lb

## 2017-10-29 DIAGNOSIS — R945 Abnormal results of liver function studies: Secondary | ICD-10-CM | POA: Diagnosis not present

## 2017-10-29 DIAGNOSIS — R5383 Other fatigue: Secondary | ICD-10-CM | POA: Diagnosis not present

## 2017-10-29 DIAGNOSIS — D72829 Elevated white blood cell count, unspecified: Secondary | ICD-10-CM | POA: Diagnosis not present

## 2017-10-29 DIAGNOSIS — R7989 Other specified abnormal findings of blood chemistry: Secondary | ICD-10-CM

## 2017-10-29 NOTE — Progress Notes (Signed)
[facility] [address] [place], [place] [address]  Internal MEDICINE  Office Visit Note  Patient Name: Maria Mckay  [id number]  [id number]  Date of Service: 11/20/2017    Pt is here for routine follow up.   Chief Complaint  Patient presents with  . Elevated Hepatic Enzymes    follow up for U/S and Labs result    Patient had elevated liver functions with routine blood work. Did have ultrasound and repeat blood work. Liver enzymes continue to climb. Hepatitis panel was negative. Ultrasound showed some potential fatty infiltration and small amount of sludge in gallbladder. She did see GI provider yesterday. Additional blood work drawn. Patient to be contacted on Thursday for results. She does report continued gas and bloating. Has noted that cheese and greasy foods can make this worse. Stress can also worsen the gas and bloating. Overall, symptoms are improved since her last visit.      Current Medication: Outpatient Encounter Medications as of 10/29/2017  Medication Sig  . amoxicillin (AMOXIL) 875 MG tablet Take 1 tablet (875 mg total) by mouth 2 (two) times daily. (Patient not taking: Reported on 10/29/2017)  . diazepam (VALIUM) 2 MG tablet Take 2 mg by mouth every 6 (six) hours as needed for anxiety.  . diazepam (VALIUM) 5 MG tablet Take 5 mg by mouth every 6 (six) hours as needed for anxiety.  Marland Kitchen levothyroxine (SYNTHROID, LEVOTHROID) 88 MCG tablet TAKE ONE TABLET BY MOUTH EVERY MORNING ON EMPTY STOMACH  . ondansetron (ZOFRAN) 4 MG tablet Take 1 tablet (4 mg total) by mouth daily as needed for nausea or vomiting. (Patient not taking: Reported on 08/26/2017)  . promethazine (PHENERGAN) 25 MG tablet TAKE 1/2 TO 1 TABLET BY MOUTH TWICE DAILY AS NEEDED FOR NAUSEA   No facility-administered encounter medications on file as of 10/29/2017.     Surgical History: Past Surgical History:  Procedure Laterality Date  . BREAST BIOPSY Left 07/08/2017  . NO PAST SURGERIES       Medical History: Past Medical History:  Diagnosis Date  . Thyroid disease     Family History: Family History  Problem Relation Age of Onset  . Breast cancer Mother 14  . Atrial fibrillation Father   . Breast cancer Maternal Aunt     Social History   Socioeconomic History  . Marital status: Married    Spouse name: Not on file  . Number of children: Not on file  . Years of education: Not on file  . Highest education level: Not on file  Occupational History    Employer: UNEMPLOYED  Social Needs  . Financial resource strain: Not on file  . Food insecurity:    Worry: Not on file    Inability: Not on file  . Transportation needs:    Medical: Not on file    Non-medical: Not on file  Tobacco Use  . Smoking status: Never Smoker  . Smokeless tobacco: Never Used  Substance and Sexual Activity  . Alcohol use: No    Alcohol/week: 0.0 oz  . Drug use: No  . Sexual activity: Not on file  Lifestyle  . Physical activity:    Days per week: Not on file    Minutes per session: Not on file  . Stress: Not on file  Relationships  . Social connections:    Talks on phone: Not on file    Gets together: Not on file    Attends religious service: Not on file  Active member of club or organization: Not on file    Attends meetings of clubs or organizations: Not on file    Relationship status: Not on file  . Intimate partner violence:    Fear of current or ex partner: Not on file    Emotionally abused: Not on file    Physically abused: Not on file    Forced sexual activity: Not on file  Other Topics Concern  . Not on file  Social History Narrative  . Not on file      Review of Systems  Constitutional: Positive for activity change and fatigue. Negative for chills and unexpected weight change.  HENT: Negative for congestion, postnasal drip, rhinorrhea, sneezing and sore throat.   Eyes: Negative.  Negative for redness.  Respiratory: Negative for cough, chest tightness and  shortness of breath.   Cardiovascular: Negative for chest pain and palpitations.  Gastrointestinal: Positive for nausea. Negative for abdominal pain, constipation, diarrhea and vomiting.  Endocrine:       Well controlled thyroid disease.  Genitourinary: Negative for dysuria and frequency.       Last menstrual cycle was 07/29/2017  Musculoskeletal: Positive for arthralgias and back pain. Negative for gait problem, joint swelling and neck pain.  Skin: Negative for rash.       Generalized eczema  Neurological: Negative for tremors, numbness and headaches.  Hematological: Negative for adenopathy. Does not bruise/bleed easily.  Psychiatric/Behavioral: Positive for sleep disturbance. Negative for behavioral problems (Depression) and suicidal ideas. The patient is nervous/anxious.     Today's Vitals   10/29/17 1112  BP: 137/78  Pulse: 85  Resp: 16  SpO2: 97%  Weight: 151 lb (68.5 kg)  Height:  (1.6 m)    Physical Exam  Constitutional: She is oriented to person, place, and time. She appears well-developed and well-nourished. No distress.  HENT:  Head: Normocephalic and atraumatic.  Mouth/Throat: Oropharynx is clear and moist. No oropharyngeal exudate.  Eyes: Pupils are equal, round, and reactive to light. EOM are normal.  Neck: Normal range of motion. Neck supple. No JVD present. No tracheal deviation present. Thyromegaly present.  Cardiovascular: Normal rate, regular rhythm and normal heart sounds. Exam reveals no gallop and no friction rub.  No murmur heard. Pulmonary/Chest: Effort normal and breath sounds normal. No respiratory distress. She has no wheezes. She has no rales. She exhibits no tenderness.  Abdominal: Soft. Bowel sounds are normal. There is no tenderness.  Musculoskeletal: Normal range of motion.  Lymphadenopathy:    She has no cervical adenopathy.  Neurological: She is alert and oriented to person, place, and time. No cranial nerve deficit.  Skin: Skin is warm and  dry. She is not diaphoretic.  Psychiatric: She has a normal mood and affect. Her behavior is normal. Judgment and thought content normal.  Nursing note and vitals reviewed.  Assessment/Plan: 1. Leukocytosis, unspecified type Recheck of labs shows resolution of elevated white blood cell count.   2. Other fatigue Improving.   3. Abnormal liver function test Negative acute hepatitis panel. Already established with GI. Will monitor.   General Counseling: Nimrat verbalizes understanding of the findings of todays visit and agrees with plan of treatment. I have discussed any further diagnostic evaluation that may be needed or ordered today. We also reviewed her medications today. she has been encouraged to call the office with any questions or concerns that should arise related to todays visit.   This patient was seen by Vincent Gros, FNP- C in Collaboration with  Dr Lyndon Code as a part of collaborative care agreement  Time spent: 15 Minutes    Dr Lyndon Code Internal medicine

## 2017-11-11 ENCOUNTER — Telehealth: Payer: Self-pay

## 2017-11-11 NOTE — Telephone Encounter (Signed)
Patient has been advised that her credit card his been declined (3) times for various amounts. Patient from here on out most have co-pay or balance in order to schedule appts. Maria Mckay

## 2017-12-11 ENCOUNTER — Other Ambulatory Visit: Payer: Self-pay

## 2017-12-11 DIAGNOSIS — E079 Disorder of thyroid, unspecified: Secondary | ICD-10-CM

## 2017-12-11 MED ORDER — LEVOTHYROXINE SODIUM 88 MCG PO TABS
ORAL_TABLET | ORAL | 3 refills | Status: DC
Start: 2017-12-11 — End: 2018-03-06

## 2018-03-06 ENCOUNTER — Ambulatory Visit (INDEPENDENT_AMBULATORY_CARE_PROVIDER_SITE_OTHER): Payer: Self-pay | Admitting: Nurse Practitioner

## 2018-03-06 ENCOUNTER — Encounter: Payer: Self-pay | Admitting: Nurse Practitioner

## 2018-03-06 VITALS — BP 140/103 | HR 87 | Resp 16 | Ht 63.0 in | Wt 157.2 lb

## 2018-03-06 DIAGNOSIS — R5383 Other fatigue: Secondary | ICD-10-CM

## 2018-03-06 DIAGNOSIS — D72829 Elevated white blood cell count, unspecified: Secondary | ICD-10-CM

## 2018-03-06 DIAGNOSIS — R110 Nausea: Secondary | ICD-10-CM

## 2018-03-06 DIAGNOSIS — E079 Disorder of thyroid, unspecified: Secondary | ICD-10-CM

## 2018-03-06 DIAGNOSIS — M064 Inflammatory polyarthropathy: Secondary | ICD-10-CM

## 2018-03-06 DIAGNOSIS — E559 Vitamin D deficiency, unspecified: Secondary | ICD-10-CM

## 2018-03-06 DIAGNOSIS — F411 Generalized anxiety disorder: Secondary | ICD-10-CM

## 2018-03-06 MED ORDER — LEVOTHYROXINE SODIUM 88 MCG PO TABS: ORAL_TABLET | ORAL | 3 refills | Status: DC

## 2018-03-06 MED ORDER — DIAZEPAM 2 MG PO TABS: 2.0000 mg | ORAL_TABLET | Freq: Two times a day (BID) | ORAL | 3 refills | Status: DC | PRN

## 2018-03-06 MED ORDER — PROMETHAZINE HCL 25 MG PO TABS: 25.0000 mg | ORAL_TABLET | Freq: Every day | ORAL | 3 refills | Status: DC | PRN

## 2018-03-06 NOTE — Progress Notes (Signed)
[facility] [address] [place], [place] [address]  Internal MEDICINE  Office Visit Note  Patient Name: Maria RakersGidget Faye Mckay  [id number]  [id number]  Date of Service: 03/16/2018  Chief Complaint  Patient presents with  . Medical Management of Chronic Issues    4 month routine follow up  . Fatigue    The patient continues to have moderate to severe fatigue. Last visit, she had similar complaints. When TSH was low, levothyroxine levels were increased to 88mcg daily. She is due to have this rechecked and examine effectiveness of the current dose. She continues to have intermittent migraine headaches. These are treated effectively with diazepam and promethazine to reduce nausea.      Current Medication: Outpatient Encounter Medications as of 03/06/2018  Medication Sig  . diazepam (VALIUM) 2 MG tablet Take 1 tablet (2 mg total) by mouth every 12 (twelve) hours as needed for anxiety.  Marland Kitchen. levothyroxine (SYNTHROID, LEVOTHROID) 88 MCG tablet TAKE ONE TABLET BY MOUTH EVERY MORNING ON EMPTY STOMACH  . promethazine (PHENERGAN) 25 MG tablet Take 1 tablet (25 mg total) by mouth daily as needed for nausea or vomiting.  . [DISCONTINUED] diazepam (VALIUM) 2 MG tablet Take 2 mg by mouth every 6 (six) hours as needed for anxiety.  . [DISCONTINUED] diazepam (VALIUM) 5 MG tablet Take 5 mg by mouth every 6 (six) hours as needed for anxiety.  . [DISCONTINUED] levothyroxine (SYNTHROID, LEVOTHROID) 88 MCG tablet TAKE ONE TABLET BY MOUTH EVERY MORNING ON EMPTY STOMACH  . [DISCONTINUED] promethazine (PHENERGAN) 25 MG tablet TAKE 1/2 TO 1 TABLET BY MOUTH TWICE DAILY AS NEEDED FOR NAUSEA  . amoxicillin (AMOXIL) 875 MG tablet Take 1 tablet (875 mg total) by mouth 2 (two) times daily. (Patient not taking: Reported on 10/29/2017)  . ondansetron (ZOFRAN) 4 MG tablet Take 1 tablet (4 mg total) by mouth daily as needed for nausea or vomiting. (Patient not taking: Reported on 08/26/2017)   No  facility-administered encounter medications on file as of 03/06/2018.     Surgical History: Past Surgical History:  Procedure Laterality Date  . BREAST BIOPSY Left 07/08/2017  . NO PAST SURGERIES      Medical History: Past Medical History:  Diagnosis Date  . Thyroid disease     Family History: Family History  Problem Relation Age of Onset  . Breast cancer Mother 1848  . Atrial fibrillation Father   . Breast cancer Maternal Aunt     Social History   Socioeconomic History  . Marital status: Married    Spouse name: Not on file  . Number of children: Not on file  . Years of education: Not on file  . Highest education level: Not on file  Occupational History    Employer: UNEMPLOYED  Social Needs  . Financial resource strain: Not on file  . Food insecurity:    Worry: Not on file    Inability: Not on file  . Transportation needs:    Medical: Not on file    Non-medical: Not on file  Tobacco Use  . Smoking status: Never Smoker  . Smokeless tobacco: Never Used  Substance and Sexual Activity  . Alcohol use: No    Alcohol/week: 0.0 standard drinks  . Drug use: No  . Sexual activity: Not on file  Lifestyle  . Physical activity:    Days per week: Not on file    Minutes per session: Not on file  . Stress: Not on file  Relationships  . Social connections:  Talks on phone: Not on file    Gets together: Not on file    Attends religious service: Not on file    Active member of club or organization: Not on file    Attends meetings of clubs or organizations: Not on file    Relationship status: Not on file  . Intimate partner violence:    Fear of current or ex partner: Not on file    Emotionally abused: Not on file    Physically abused: Not on file    Forced sexual activity: Not on file  Other Topics Concern  . Not on file  Social History Narrative  . Not on file      Review of Systems  Constitutional: Positive for activity change and fatigue. Negative for chills  and unexpected weight change.  HENT: Negative for congestion, postnasal drip, rhinorrhea, sneezing and sore throat.   Eyes: Negative.  Negative for redness.  Respiratory: Negative for cough, chest tightness, shortness of breath and wheezing.   Cardiovascular: Negative for chest pain and palpitations.       Blood pressure is elevated today.   Gastrointestinal: Positive for nausea. Negative for abdominal pain, constipation, diarrhea and vomiting.  Endocrine:       Well controlled thyroid disease.  Genitourinary: Negative for difficulty urinating, dysuria, frequency and menstrual problem.  Musculoskeletal: Positive for arthralgias and back pain. Negative for gait problem, joint swelling and neck pain.  Skin: Negative for rash.       Generalized eczema  Allergic/Immunologic: Negative for environmental allergies.  Neurological: Positive for headaches. Negative for dizziness, tremors and numbness.  Hematological: Negative for adenopathy. Does not bruise/bleed easily.  Psychiatric/Behavioral: Positive for sleep disturbance. Negative for behavioral problems (Depression) and suicidal ideas. The patient is nervous/anxious.     Today's Vitals   03/06/18 1202  BP: (!) 140/103  Pulse: 87  Resp: 16  SpO2: 97%  Weight: 157 lb 3.2 oz (71.3 kg)  Height: 5\' 3"  (1.6 m)    Physical Exam  Constitutional: She is oriented to person, place, and time. She appears well-developed and well-nourished. No distress.  HENT:  Head: Normocephalic and atraumatic.  Nose: Nose normal.  Mouth/Throat: Oropharynx is clear and moist. No oropharyngeal exudate.  Eyes: Pupils are equal, round, and reactive to light. Conjunctivae and EOM are normal.  Neck: Normal range of motion. Neck supple. No JVD present. No tracheal deviation present. Thyromegaly present.  Cardiovascular: Normal rate, regular rhythm and normal heart sounds. Exam reveals no gallop and no friction rub.  No murmur heard. Pulmonary/Chest: Effort normal  and breath sounds normal. No respiratory distress. She has no wheezes. She has no rales. She exhibits no tenderness.  Abdominal: Soft. Bowel sounds are normal. There is no tenderness.  Musculoskeletal: Normal range of motion.  Lymphadenopathy:    She has no cervical adenopathy.  Neurological: She is alert and oriented to person, place, and time. No cranial nerve deficit.  Skin: Skin is warm and dry. She is not diaphoretic.  Psychiatric: She has a normal mood and affect. Her behavior is normal. Judgment and thought content normal.  Nursing note and vitals reviewed.  Assessment/Plan: 1. Thyroid disease Will check thyroid panel and adjust levothyroxine as indicated.  - levothyroxine (SYNTHROID, LEVOTHROID) 88 MCG tablet; TAKE ONE TABLET BY MOUTH EVERY MORNING ON EMPTY STOMACH  Dispense: 30 tablet; Refill: 3 - TSH + free T4  2. Other fatigue Check labs for evaluation. - CBC With Differential - TSH + free T4 - Iron, TIBC  and Ferritin Panel - Vitamin B12 - Hemoglobin A1c  3. Nausea May take promethazine as needed and as prescribed for nausea, mostly associated with migraine headaches.  - promethazine (PHENERGAN) 25 MG tablet; Take 1 tablet (25 mg total) by mouth daily as needed for nausea or vomiting.  Dispense: 30 tablet; Refill: 3  4. Generalized anxiety disorder May take valium 2mg  twice daily if needed for anxiety/muscle tension.  - diazepam (VALIUM) 2 MG tablet; Take 1 tablet (2 mg total) by mouth every 12 (twelve) hours as needed for anxiety.  Dispense: 45 tablet; Refill: 3  5. Inflammatory polyarthritis (HCC) Check labs for further evaluation.  - ANA w/Reflex if Positive - Rheumatoid factor - Sedimentation rate - Uric acid - CBC With Differential - Comprehensive metabolic panel  6. Leukocytosis, unspecified type Check labs.  - Iron, TIBC and Ferritin Panel - Vitamin B12  7. Vitamin D deficiency - Vitamin D 1,25 dihydroxy  General Counseling: Sanvi verbalizes  understanding of the findings of todays visit and agrees with plan of treatment. I have discussed any further diagnostic evaluation that may be needed or ordered today. We also reviewed her medications today. she has been encouraged to call the office with any questions or concerns that should arise related to todays visit.  This patient was seen by Vincent Gros FNP Collaboration with Dr Lyndon Code as a part of collaborative care agreement  Orders Placed This Encounter  Procedures  . ANA w/Reflex if Positive  . Rheumatoid factor  . Sedimentation rate  . Uric acid  . CBC With Differential  . Comprehensive metabolic panel  . TSH + free T4  . Iron, TIBC and Ferritin Panel  . Vitamin B12  . Vitamin D 1,25 dihydroxy  . Hemoglobin A1c    Meds ordered this encounter  Medications  . diazepam (VALIUM) 2 MG tablet    Sig: Take 1 tablet (2 mg total) by mouth every 12 (twelve) hours as needed for anxiety.    Dispense:  45 tablet    Refill:  3    Patient may take 2 tablets at bedtime if needed for anxiety/insomnia    Order Specific Question:   Supervising Provider    Answer:   Lyndon Code [[id number]]  . levothyroxine (SYNTHROID, LEVOTHROID) 88 MCG tablet    Sig: TAKE ONE TABLET BY MOUTH EVERY MORNING ON EMPTY STOMACH    Dispense:  30 tablet    Refill:  3    Order Specific Question:   Supervising Provider    Answer:   Lyndon Code [[id number]]  . promethazine (PHENERGAN) 25 MG tablet    Sig: Take 1 tablet (25 mg total) by mouth daily as needed for nausea or vomiting.    Dispense:  30 tablet    Refill:  3    Order Specific Question:   Supervising Provider    Answer:   Lyndon Code [[id number]]    Time spent: 71 Minutes      Dr Lyndon Code Internal medicine

## 2018-03-16 DIAGNOSIS — R110 Nausea: Secondary | ICD-10-CM | POA: Insufficient documentation

## 2018-03-16 DIAGNOSIS — E559 Vitamin D deficiency, unspecified: Secondary | ICD-10-CM | POA: Insufficient documentation

## 2018-03-16 DIAGNOSIS — F411 Generalized anxiety disorder: Secondary | ICD-10-CM | POA: Insufficient documentation

## 2018-03-16 DIAGNOSIS — M064 Inflammatory polyarthropathy: Secondary | ICD-10-CM | POA: Insufficient documentation

## 2018-05-14 ENCOUNTER — Other Ambulatory Visit: Payer: Self-pay | Admitting: Nurse Practitioner

## 2018-05-14 MED ORDER — CLOBETASOL PROPIONATE 0.05 % EX OINT: TOPICAL_OINTMENT | CUTANEOUS | 3 refills | Status: DC

## 2018-06-05 ENCOUNTER — Other Ambulatory Visit: Payer: Self-pay | Admitting: Nurse Practitioner

## 2018-06-19 ENCOUNTER — Other Ambulatory Visit: Payer: Self-pay

## 2018-06-19 DIAGNOSIS — E079 Disorder of thyroid, unspecified: Secondary | ICD-10-CM

## 2018-06-19 MED ORDER — LEVOTHYROXINE SODIUM 88 MCG PO TABS: ORAL_TABLET | ORAL | 3 refills | Status: DC

## 2018-07-28 ENCOUNTER — Other Ambulatory Visit: Payer: Self-pay | Admitting: Nurse Practitioner

## 2018-08-07 ENCOUNTER — Ambulatory Visit: Payer: Self-pay | Admitting: Nurse Practitioner

## 2018-08-16 IMAGING — MG US BREAST BX W LOC DEV 1ST LESION IMG BX SPEC US GUIDE*L*
1 series · 8 of 8 positions shown · non-contrast
Comparison: Previous exam(s).

ADDENDUM:
Pathology of the left breast biopsy revealed A. LEFT BREAST, [DATE], 1
CMFN; ULTRASOUND-GUIDED CORE BIOPSY: FIBROCYSTIC CHANGE. COLUMNAR
CELL CHANGE. PSEUDO-ANGIOMATOUS STROMAL HYPERPLASIA.

This was found to be concordant with Dr. Elyana impression and
notes.
Recommendation: Six month follow-up ultrasound of the left axilla
per recommendations on report of mammogram and ultrasound on 06/26/17.
At the patient's request, results and recommendations were relayed
to the patient by phone by Tiger, Winnie on 07/09/17 at [DATE].
The patient stated she has done well following the biopsy with no
bleeding, bruising, or pain. Post biopsy instructions were reviewed
with the patient. All of her questions were answered. She was
encouraged to contact the [HOSPITAL] with any further
questions or concerns.
Addendum by Tiger, Winnie on 07/09/17.
CLINICAL DATA: Patient presents for ultrasound-guided core needle
biopsy of small masses in the 3:30 o'clock position of the left
breast.
EXAM:
ULTRASOUND GUIDED LEFT BREAST CORE NEEDLE BIOPSY

[Series 1: MG view · 0.04mm/px · 8 of 16 slices shown]
[im 1/16]
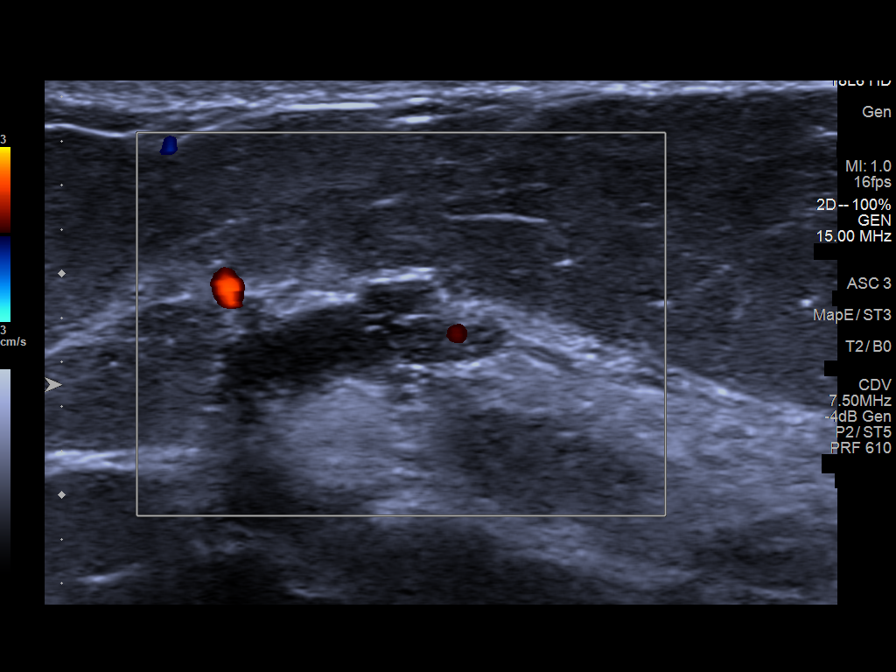
[im 3/16]
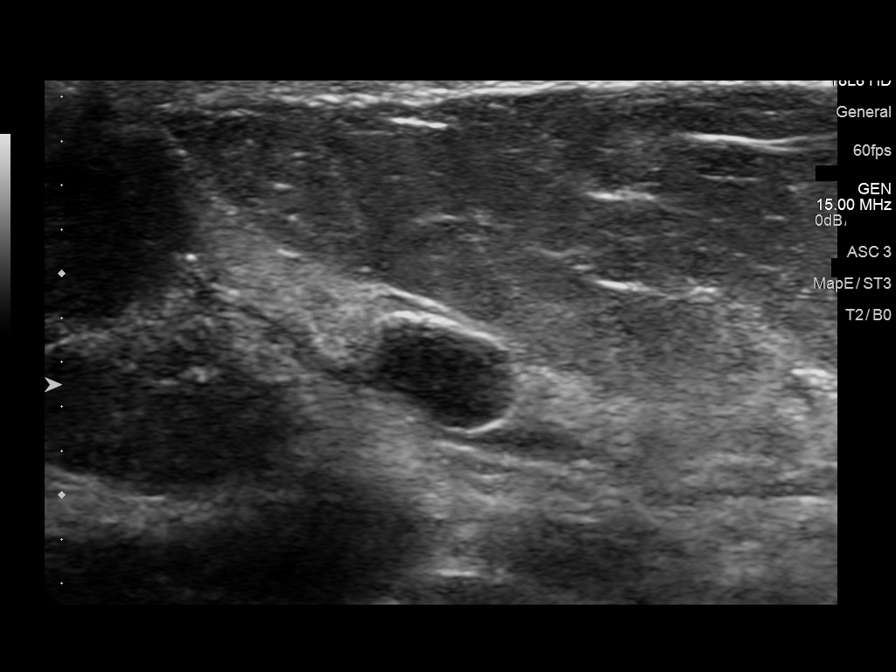
[im 5/16]
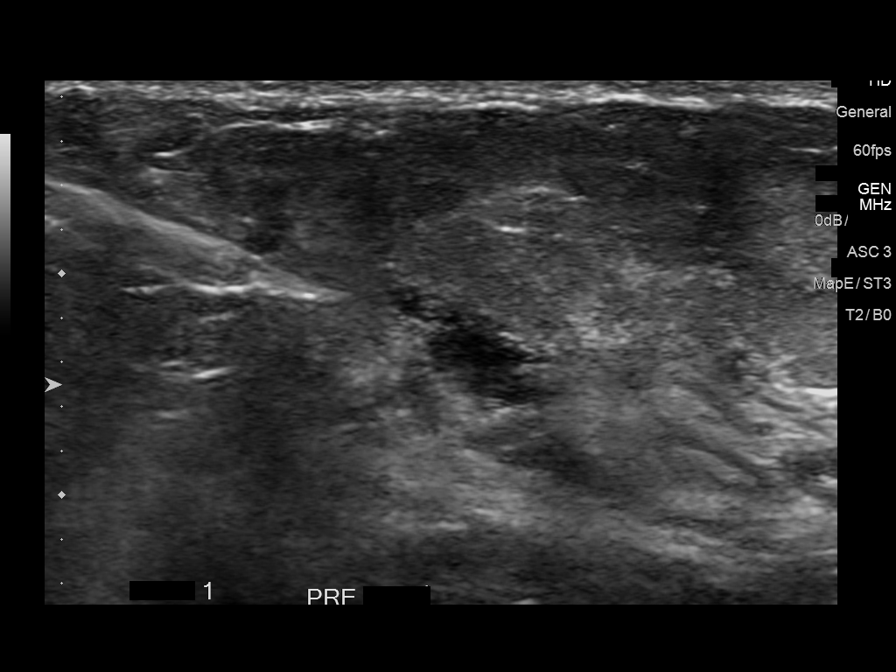
[im 7/16]
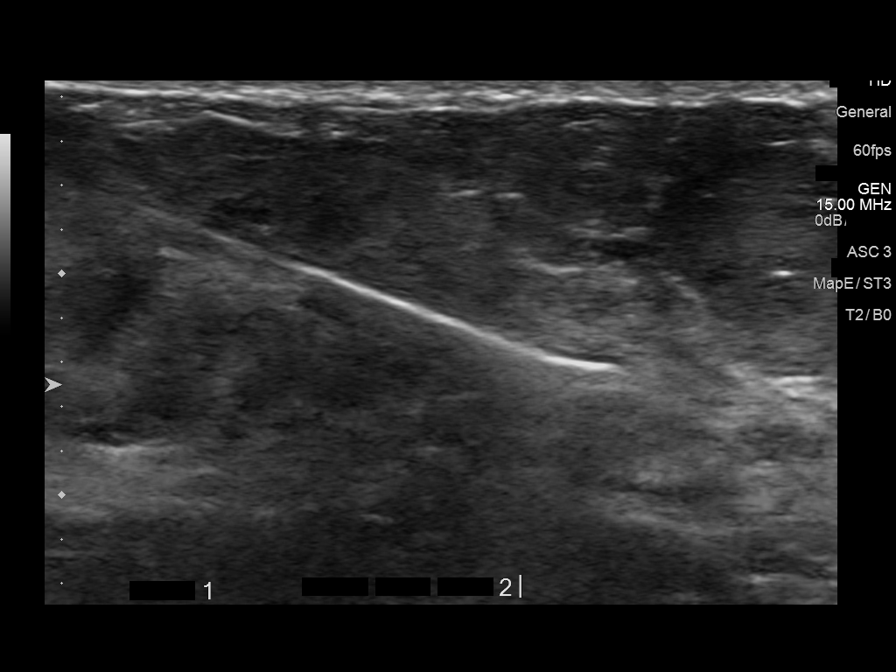
[im 9/16]
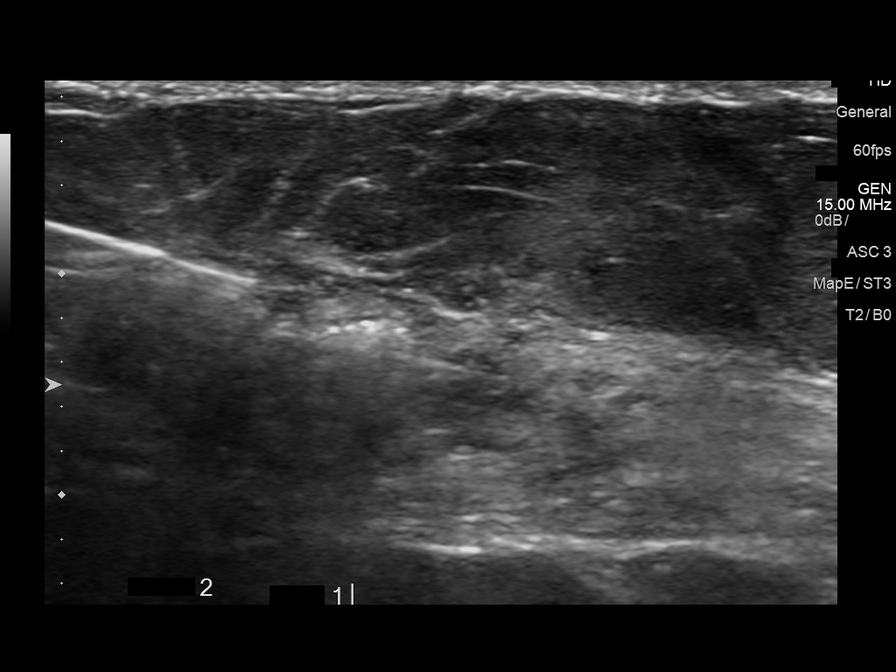
[im 11/16]
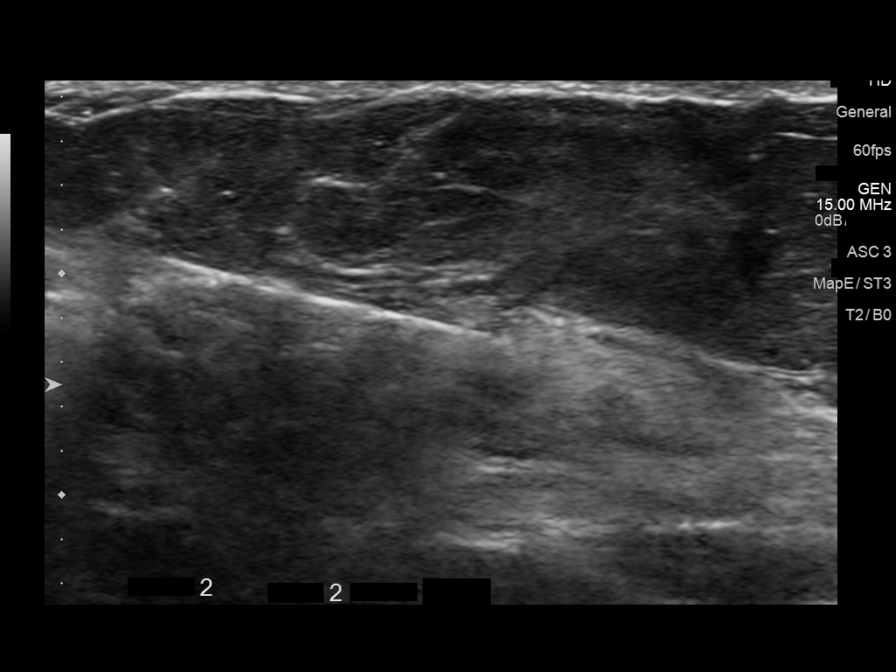
[im 13/16]
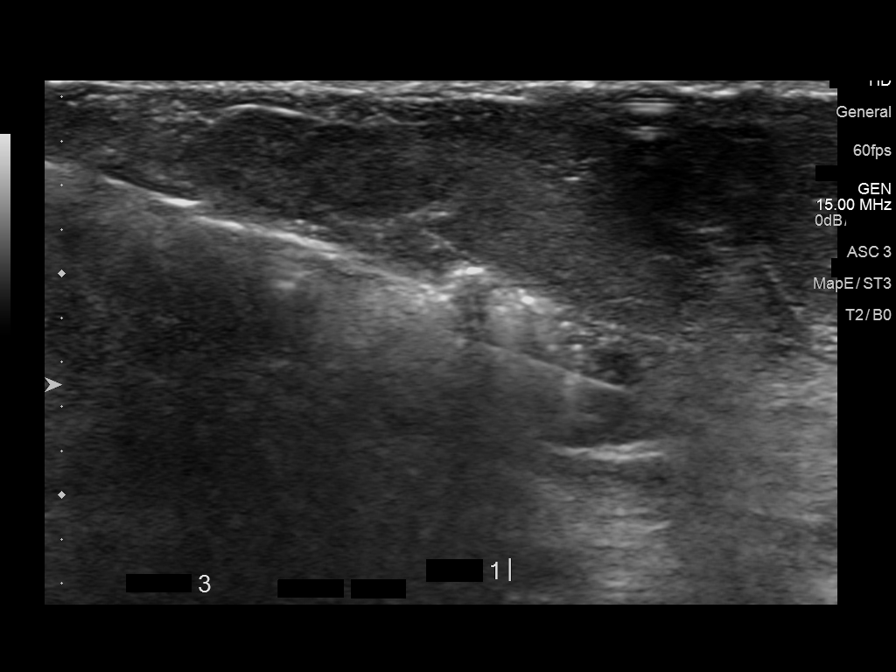
[im 16/16]
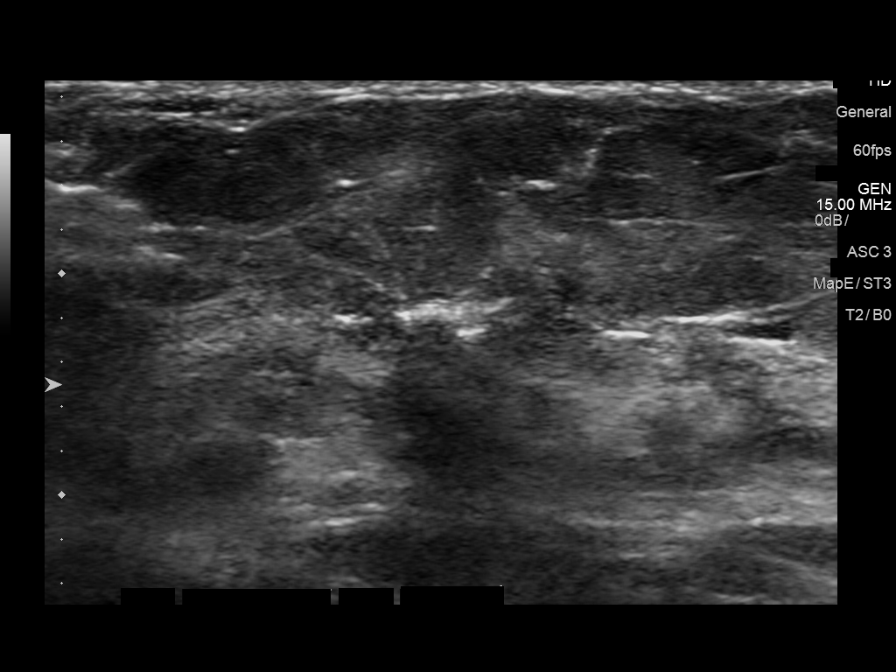

[8 of 8 positions shown; findings below may reference images not displayed]



Lesion quadrant: Lower outer quadrant

Using sterile technique and 1% Lidocaine as local anesthetic, under
direct ultrasound visualization, a 12 gauge Danii device was
used to perform biopsy of the group of small adjacent masses in the
3:30 o'clock position of the left breast using an inferior, medial
approach. At the conclusion of the procedure a ribbon shaped tissue
marker clip was deployed into the biopsy cavity. Follow up 2 view
mammogram was performed and dictated separately.
IMPRESSION: Ultrasound guided biopsy of of the left breast. No apparent
complications.

## 2018-10-20 ENCOUNTER — Telehealth: Payer: Self-pay

## 2018-10-20 DIAGNOSIS — E079 Disorder of thyroid, unspecified: Secondary | ICD-10-CM

## 2018-10-20 MED ORDER — LEVOTHYROXINE SODIUM 88 MCG PO TABS: ORAL_TABLET | ORAL | 0 refills | Status: DC

## 2018-10-20 NOTE — Telephone Encounter (Signed)
lmom need appt for med refills

## 2018-10-21 ENCOUNTER — Encounter: Payer: Self-pay | Admitting: Nurse Practitioner

## 2018-10-21 ENCOUNTER — Other Ambulatory Visit: Payer: Self-pay

## 2018-10-21 ENCOUNTER — Ambulatory Visit: Payer: BLUE CROSS/BLUE SHIELD | Admitting: Nurse Practitioner

## 2018-10-21 DIAGNOSIS — F411 Generalized anxiety disorder: Secondary | ICD-10-CM | POA: Diagnosis not present

## 2018-10-21 DIAGNOSIS — R110 Nausea: Secondary | ICD-10-CM | POA: Diagnosis not present

## 2018-10-21 DIAGNOSIS — E079 Disorder of thyroid, unspecified: Secondary | ICD-10-CM

## 2018-10-21 MED ORDER — LEVOTHYROXINE SODIUM 88 MCG PO TABS: ORAL_TABLET | ORAL | 3 refills | Status: DC

## 2018-10-21 MED ORDER — PROMETHAZINE HCL 25 MG PO TABS: 25.0000 mg | ORAL_TABLET | Freq: Every day | ORAL | 3 refills | Status: DC | PRN

## 2018-10-21 MED ORDER — DIAZEPAM 2 MG PO TABS: 2.0000 mg | ORAL_TABLET | Freq: Two times a day (BID) | ORAL | 3 refills | Status: DC | PRN

## 2018-10-21 NOTE — Progress Notes (Signed)
[facility] [address] [place], [place] [address]  Internal MEDICINE  Telephone Visit  Patient Name: Maria Mckay  [id number]  [id number]  Date of Service: 11/09/2018  I connected with the patient at 3:16pm by webcam and verified the patients identity using two identifiers.   I discussed the limitations, risks, security and privacy concerns of performing an evaluation and management service by webcam and the availability of in person appointments. I also discussed with the patient that there may be a patient responsible charge related to the service.  The patient expressed understanding and agrees to proceed.    Chief Complaint  Patient presents with  . Telephone Assessment  . Telephone Screen  . Hypothyroidism    The patient has been contacted via webcam for follow up visit due to concerns for spread of novel coronavirus. She states that she feels well and has no concerns or complaints today. An order for routine, fasting labs was given at her most recent visit. She states she is waiting to get these done until fear of COVID 19 begins to settle down. She does need to have refills of her routine medications.       Current Medication: Outpatient Encounter Medications as of 10/21/2018  Medication Sig  . clobetasol ointment (TEMOVATE) 0.05 % Apply to affected areas twice day as needed for eczema  . diazepam (VALIUM) 2 MG tablet Take 1 tablet (2 mg total) by mouth every 12 (twelve) hours as needed for anxiety.  Marland Kitchen. levothyroxine (SYNTHROID) 88 MCG tablet TAKE ONE TABLET BY MOUTH EVERY MORNING ON EMPTY STOMACH  . promethazine (PHENERGAN) 25 MG tablet Take 1 tablet (25 mg total) by mouth daily as needed for nausea or vomiting.  . [DISCONTINUED] diazepam (VALIUM) 2 MG tablet Take 1 tablet (2 mg total) by mouth every 12 (twelve) hours as needed for anxiety.  . [DISCONTINUED] levothyroxine (SYNTHROID) 88 MCG tablet TAKE ONE TABLET BY MOUTH EVERY MORNING ON EMPTY STOMACH  .  [DISCONTINUED] promethazine (PHENERGAN) 25 MG tablet Take 1 tablet (25 mg total) by mouth daily as needed for nausea or vomiting.  . [DISCONTINUED] amoxicillin (AMOXIL) 875 MG tablet Take 1 tablet (875 mg total) by mouth 2 (two) times daily. (Patient not taking: Reported on 10/29/2017)  . [DISCONTINUED] ondansetron (ZOFRAN) 4 MG tablet Take 1 tablet (4 mg total) by mouth daily as needed for nausea or vomiting. (Patient not taking: Reported on 08/26/2017)   No facility-administered encounter medications on file as of 10/21/2018.     Surgical History: Past Surgical History:  Procedure Laterality Date  . BREAST BIOPSY Left 07/08/2017  . NO PAST SURGERIES      Medical History: Past Medical History:  Diagnosis Date  . Thyroid disease     Family History: Family History  Problem Relation Age of Onset  . Breast cancer Mother [id number]  . Atrial fibrillation Father   . Breast cancer Maternal Aunt     Social History   Socioeconomic History  . Marital status: Married    Spouse name: Not on file  . Number of children: Not on file  . Years of education: Not on file  . Highest education level: Not on file  Occupational History    Employer: UNEMPLOYED  Social Needs  . Financial resource strain: Not on file  . Food insecurity:    Worry: Not on file    Inability: Not on file  . Transportation needs:    Medical: Not on file    Non-medical: Not  on file  Tobacco Use  . Smoking status: Never Smoker  . Smokeless tobacco: Never Used  Substance and Sexual Activity  . Alcohol use: No    Alcohol/week: 0.0 standard drinks  . Drug use: No  . Sexual activity: Not on file  Lifestyle  . Physical activity:    Days per week: Not on file    Minutes per session: Not on file  . Stress: Not on file  Relationships  . Social connections:    Talks on phone: Not on file    Gets together: Not on file    Attends religious service: Not on file    Active member of club or organization: Not on file    Attends  meetings of clubs or organizations: Not on file    Relationship status: Not on file  . Intimate partner violence:    Fear of current or ex partner: Not on file    Emotionally abused: Not on file    Physically abused: Not on file    Forced sexual activity: Not on file  Other Topics Concern  . Not on file  Social History Narrative  . Not on file      Review of Systems  Constitutional: Positive for fatigue. Negative for activity change, chills and unexpected weight change.  HENT: Negative for congestion, postnasal drip, rhinorrhea, sneezing and sore throat.   Respiratory: Negative for cough, chest tightness, shortness of breath and wheezing.   Cardiovascular: Negative for chest pain and palpitations.  Gastrointestinal: Positive for nausea. Negative for abdominal pain, constipation, diarrhea and vomiting.  Endocrine: Negative for cold intolerance, heat intolerance, polydipsia and polyuria.       Well controlled thyroid disease.  Musculoskeletal: Positive for arthralgias and back pain. Negative for gait problem, joint swelling and neck pain.  Skin: Negative for rash.       Generalized eczema  Allergic/Immunologic: Negative for environmental allergies.  Neurological: Positive for headaches. Negative for dizziness, tremors and numbness.  Hematological: Negative for adenopathy. Does not bruise/bleed easily.  Psychiatric/Behavioral: Positive for sleep disturbance. Negative for behavioral problems (Depression) and suicidal ideas. The patient is nervous/anxious.     Today's Vitals   10/21/18 1438  BP: 119/80  Pulse: 80  Resp: 16  Weight: 147 lb (66.7 kg)  Height: 5\' 3"  (1.6 m)   Body mass index is 26.04 kg/m.  Observation/Objective:   The patient is alert and oriented. She is pleasant and answers all questions appropriately. Breathing is non-labored. She is in no acute distress at this time.    Assessment/Plan: 1. Thyroid disease Thyroid panel ordered. Will adjust dosing of  levothyroxine as indicated.  - levothyroxine (SYNTHROID) 88 MCG tablet; TAKE ONE TABLET BY MOUTH EVERY MORNING ON EMPTY STOMACH  Dispense: 30 tablet; Refill: 3  2. Nausea Associated with migraine headaches. May take promethazine 25mg  daily if needed. New prescription provided today.  - promethazine (PHENERGAN) 25 MG tablet; Take 1 tablet (25 mg total) by mouth daily as needed for nausea or vomiting.  Dispense: 30 tablet; Refill: 3  3. Generalized anxiety disorder May take valium 2mg  tablets twice daily as needed for acute anxiety/difficulty sleeping. New prescription sent to her pharmacy today.  - diazepam (VALIUM) 2 MG tablet; Take 1 tablet (2 mg total) by mouth every 12 (twelve) hours as needed for anxiety.  Dispense: 45 tablet; Refill: 3  General Counseling: Danika verbalizes understanding of the findings of today's phone visit and agrees with plan of treatment. I have discussed any further diagnostic  evaluation that may be needed or ordered today. We also reviewed her medications today. she has been encouraged to call the office with any questions or concerns that should arise related to todays visit.  This patient was seen by Vincent Gros FNP Collaboration with Dr Lyndon Code as a part of collaborative care agreement  Meds ordered this encounter  Medications  . levothyroxine (SYNTHROID) 88 MCG tablet    Sig: TAKE ONE TABLET BY MOUTH EVERY MORNING ON EMPTY STOMACH    Dispense:  30 tablet    Refill:  3    Order Specific Question:   Supervising Provider    Answer:   Lyndon Code [[id number]]  . diazepam (VALIUM) 2 MG tablet    Sig: Take 1 tablet (2 mg total) by mouth every 12 (twelve) hours as needed for anxiety.    Dispense:  45 tablet    Refill:  3    Patient may take 2 tablets at bedtime if needed for anxiety/insomnia    Order Specific Question:   Supervising Provider    Answer:   Lyndon Code [[id number]]  . promethazine (PHENERGAN) 25 MG tablet    Sig: Take 1 tablet (25 mg total) by  mouth daily as needed for nausea or vomiting.    Dispense:  30 tablet    Refill:  3    Order Specific Question:   Supervising Provider    Answer:   Lyndon Code [[id number]]    Time spent: 37 Minutes    Dr Lyndon Code Internal medicine

## 2019-02-20 ENCOUNTER — Other Ambulatory Visit: Payer: Self-pay | Admitting: Nurse Practitioner

## 2019-02-21 LAB — IRON AND TIBC
Iron Saturation: 14 % — ABNORMAL LOW (ref 15–55)
Iron: 61 ug/dL (ref 27–159)
Total Iron Binding Capacity: 422 ug/dL (ref 250–450)
UIBC: 361 ug/dL (ref 131–425)

## 2019-02-21 LAB — T4, FREE: Free T4: 1.28 ng/dL (ref 0.82–1.77)

## 2019-02-21 LAB — CBC WITH DIFFERENTIAL/PLATELET
Basophils Absolute: 0.1 10*3/uL (ref 0.0–0.2)
Basos: 1 %
EOS (ABSOLUTE): 0.2 10*3/uL (ref 0.0–0.4)
Eos: 2 %
Hematocrit: 42.1 % (ref 34.0–46.6)
Hemoglobin: 14.3 g/dL (ref 11.1–15.9)
Immature Grans (Abs): 0 10*3/uL (ref 0.0–0.1)
Immature Granulocytes: 0 %
Lymphocytes Absolute: 3.6 10*3/uL — ABNORMAL HIGH (ref 0.7–3.1)
Lymphs: 29 %
MCH: 28.3 pg (ref 26.6–33.0)
MCHC: 34 g/dL (ref 31.5–35.7)
MCV: 83 fL (ref 79–97)
Monocytes Absolute: 0.5 10*3/uL (ref 0.1–0.9)
Monocytes: 4 %
Neutrophils Absolute: 7.8 10*3/uL — ABNORMAL HIGH (ref 1.4–7.0)
Neutrophils: 64 %
Platelets: 413 10*3/uL (ref 150–450)
RBC: 5.06 x10E6/uL (ref 3.77–5.28)
RDW: 13 % (ref 11.7–15.4)
WBC: 12.2 10*3/uL — ABNORMAL HIGH (ref 3.4–10.8)

## 2019-02-21 LAB — SEDIMENTATION RATE: Sed Rate: 19 mm/hr (ref 0–32)

## 2019-02-21 LAB — RHEUMATOID FACTOR: Rhuematoid fact SerPl-aCnc: <10 IU/mL (ref 0.0–13.9)

## 2019-02-21 LAB — BASIC METABOLIC PANEL
BUN/Creatinine Ratio: 15 (ref 9–23)
BUN: 10 mg/dL (ref 6–20)
CO2: 21 mmol/L (ref 20–29)
Calcium: 10 mg/dL (ref 8.7–10.2)
Chloride: 102 mmol/L (ref 96–106)
Creatinine, Ser: 0.68 mg/dL (ref 0.57–1.00)
GFR calc Af Amer: 127 mL/min/{1.73_m2} (ref >59)
GFR calc non Af Amer: 111 mL/min/{1.73_m2} (ref >59)
Glucose: 113 mg/dL — ABNORMAL HIGH (ref 65–99)
Potassium: 4.1 mmol/L (ref 3.5–5.2)
Sodium: 142 mmol/L (ref 134–144)

## 2019-02-21 LAB — URIC ACID: Uric Acid: 6 mg/dL (ref 2.5–7.1)

## 2019-02-21 LAB — B12 AND FOLATE PANEL
Folate: 10.1 ng/mL (ref >3.0)
Vitamin B-12: 721 pg/mL (ref 232–1245)

## 2019-02-21 LAB — VITAMIN D 25 HYDROXY (VIT D DEFICIENCY, FRACTURES): Vit D, 25-Hydroxy: 37 ng/mL (ref 30.0–100.0)

## 2019-02-21 LAB — TSH: TSH: 4.84 u[IU]/mL — ABNORMAL HIGH (ref 0.450–4.500)

## 2019-02-21 LAB — HGB A1C W/O EAG: Hgb A1c MFr Bld: 5.6 % (ref 4.8–5.6)

## 2019-02-21 LAB — ANA: Anti Nuclear Antibody (ANA): NEGATIVE

## 2019-02-21 LAB — FERRITIN: Ferritin: 28 ng/mL (ref 15–150)

## 2019-02-21 LAB — T4: T4, Total: 9.3 ug/dL (ref 4.5–12.0)

## 2019-02-24 ENCOUNTER — Ambulatory Visit: Payer: BLUE CROSS/BLUE SHIELD | Admitting: Nurse Practitioner

## 2019-02-25 ENCOUNTER — Ambulatory Visit (INDEPENDENT_AMBULATORY_CARE_PROVIDER_SITE_OTHER): Payer: BLUE CROSS/BLUE SHIELD | Admitting: Nurse Practitioner

## 2019-02-25 ENCOUNTER — Encounter: Payer: Self-pay | Admitting: Nurse Practitioner

## 2019-02-25 ENCOUNTER — Other Ambulatory Visit: Payer: Self-pay

## 2019-02-25 VITALS — BP 133/92 | HR 78 | Resp 16 | Ht 63.0 in | Wt 155.2 lb

## 2019-02-25 DIAGNOSIS — D72829 Elevated white blood cell count, unspecified: Secondary | ICD-10-CM | POA: Diagnosis not present

## 2019-02-25 DIAGNOSIS — E079 Disorder of thyroid, unspecified: Secondary | ICD-10-CM | POA: Diagnosis not present

## 2019-02-25 DIAGNOSIS — R110 Nausea: Secondary | ICD-10-CM

## 2019-02-25 DIAGNOSIS — F411 Generalized anxiety disorder: Secondary | ICD-10-CM | POA: Diagnosis not present

## 2019-02-25 DIAGNOSIS — J0140 Acute pansinusitis, unspecified: Secondary | ICD-10-CM

## 2019-02-25 MED ORDER — PROMETHAZINE HCL 25 MG PO TABS: 25.0000 mg | ORAL_TABLET | Freq: Every day | ORAL | 3 refills | Status: AC | PRN

## 2019-02-25 MED ORDER — DIAZEPAM 2 MG PO TABS: 2.0000 mg | ORAL_TABLET | Freq: Two times a day (BID) | ORAL | 3 refills | Status: AC | PRN

## 2019-02-25 MED ORDER — AZITHROMYCIN 250 MG PO TABS: ORAL_TABLET | ORAL | 0 refills | Status: DC

## 2019-02-25 MED ORDER — LEVOTHYROXINE SODIUM 88 MCG PO TABS: ORAL_TABLET | ORAL | 3 refills | Status: DC

## 2019-02-25 NOTE — Progress Notes (Signed)
[facility] [place], [place] [address]  Internal MEDICINE  Office Visit Note  Patient Name: Maria Mckay  [id number]  [id number]  Date of Service: 03/04/2019  Chief Complaint  Patient presents with  . Thyroid Problem  . Quality Metric Gaps    needs a pap this year    The patient is here for routine follow up. Had labs last week. She has elevated WBC and neutrophils. Remaining CBC was normal. She does not feel any signs and symptoms of infection. Denies congestion, headache, fever, or chills. She has no new nausea or vomiting. Her thyroid panel was normal. Connective tissue and anemia panels were both normal.       Current Medication: Outpatient Encounter Medications as of 02/25/2019  Medication Sig  . clobetasol ointment (TEMOVATE) 0.05 % Apply to affected areas twice day as needed for eczema  . diazepam (VALIUM) 2 MG tablet Take 1 tablet (2 mg total) by mouth every 12 (twelve) hours as needed for anxiety.  Marland Kitchen levothyroxine (SYNTHROID) 88 MCG tablet TAKE ONE TABLET BY MOUTH EVERY MORNING ON EMPTY STOMACH  . promethazine (PHENERGAN) 25 MG tablet Take 1 tablet (25 mg total) by mouth daily as needed for nausea or vomiting.  . [DISCONTINUED] diazepam (VALIUM) 2 MG tablet Take 1 tablet (2 mg total) by mouth every 12 (twelve) hours as needed for anxiety.  . [DISCONTINUED] levothyroxine (SYNTHROID) 88 MCG tablet TAKE ONE TABLET BY MOUTH EVERY MORNING ON EMPTY STOMACH  . [DISCONTINUED] promethazine (PHENERGAN) 25 MG tablet Take 1 tablet (25 mg total) by mouth daily as needed for nausea or vomiting.  Marland Kitchen azithromycin (ZITHROMAX) 250 MG tablet z-pack - take as directed for 5 days for sinus infection.   No facility-administered encounter medications on file as of 02/25/2019.     Surgical History: Past Surgical History:  Procedure Laterality Date  . BREAST BIOPSY Left 07/08/2017  . NO PAST SURGERIES      Medical History: Past Medical History:  Diagnosis Date   . Thyroid disease     Family History: Family History  Problem Relation Age of Onset  . Breast cancer Mother 25  . Atrial fibrillation Father   . Breast cancer Maternal Aunt     Social History   Socioeconomic History  . Marital status: Married    Spouse name: Not on file  . Number of children: Not on file  . Years of education: Not on file  . Highest education level: Not on file  Occupational History    Employer: UNEMPLOYED  Social Needs  . Financial resource strain: Not on file  . Food insecurity    Worry: Not on file    Inability: Not on file  . Transportation needs    Medical: Not on file    Non-medical: Not on file  Tobacco Use  . Smoking status: Never Smoker  . Smokeless tobacco: Never Used  Substance and Sexual Activity  . Alcohol use: No    Alcohol/week: 0.0 standard drinks  . Drug use: No  . Sexual activity: Not on file  Lifestyle  . Physical activity    Days per week: Not on file    Minutes per session: Not on file  . Stress: Not on file  Relationships  . Social Herbalist on phone: Not on file    Gets together: Not on file    Attends religious service: Not on file    Active member of club or organization: Not on  file    Attends meetings of clubs or organizations: Not on file    Relationship status: Not on file  . Intimate partner violence    Fear of current or ex partner: Not on file    Emotionally abused: Not on file    Physically abused: Not on file    Forced sexual activity: Not on file  Other Topics Concern  . Not on file  Social History Narrative  . Not on file      Review of Systems  Constitutional: Positive for fatigue. Negative for activity change, chills and unexpected weight change.  HENT: Negative for congestion, postnasal drip, rhinorrhea, sneezing and sore throat.   Respiratory: Negative for cough, chest tightness, shortness of breath and wheezing.   Cardiovascular: Negative for chest pain and palpitations.   Gastrointestinal: Positive for nausea. Negative for abdominal pain, constipation, diarrhea and vomiting.  Endocrine: Negative for cold intolerance, heat intolerance, polydipsia and polyuria.       Well controlled thyroid disease.  Musculoskeletal: Positive for arthralgias and back pain. Negative for gait problem, joint swelling and neck pain.  Skin: Negative for rash.       Generalized eczema  Allergic/Immunologic: Negative for environmental allergies.  Neurological: Positive for headaches. Negative for dizziness, tremors and numbness.  Hematological: Negative for adenopathy. Does not bruise/bleed easily.  Psychiatric/Behavioral: Positive for sleep disturbance. Negative for behavioral problems (Depression) and suicidal ideas. The patient is nervous/anxious.    Today's Vitals   02/25/19 0927  BP: (!) 133/92  Pulse: 78  Resp: 16  SpO2: 98%  Weight: 155 lb 3.2 oz (70.4 kg)  Height: 5\' 3"  (1.6 m)   Body mass index is 27.49 kg/m.  Physical Exam Vitals signs and nursing note reviewed.  Constitutional:      General: She is not in acute distress.    Appearance: Normal appearance. She is well-developed. She is not diaphoretic.  HENT:     Head: Normocephalic and atraumatic.     Nose: Nose normal.     Mouth/Throat:     Pharynx: No oropharyngeal exudate.  Eyes:     Conjunctiva/sclera: Conjunctivae normal.     Pupils: Pupils are equal, round, and reactive to light.  Neck:     Musculoskeletal: Normal range of motion and neck supple.     Thyroid: Thyromegaly present.     Vascular: No JVD.     Trachea: No tracheal deviation.  Cardiovascular:     Rate and Rhythm: Normal rate and regular rhythm.     Heart sounds: Normal heart sounds. No murmur. No friction rub. No gallop.   Pulmonary:     Effort: Pulmonary effort is normal. No respiratory distress.     Breath sounds: Normal breath sounds. No wheezing or rales.  Chest:     Chest wall: No tenderness.  Abdominal:     General: Bowel  sounds are normal.     Palpations: Abdomen is soft.     Tenderness: There is no abdominal tenderness.  Musculoskeletal: Normal range of motion.  Lymphadenopathy:     Cervical: No cervical adenopathy.  Skin:    General: Skin is warm and dry.  Neurological:     Mental Status: She is alert and oriented to person, place, and time.     Cranial Nerves: No cranial nerve deficit.  Psychiatric:        Behavior: Behavior normal.        Thought Content: Thought content normal.        Judgment: Judgment  normal.     Assessment/Plan: 1. Leukocytosis, unspecified type Will treat with z-pack. Take as directed for 5 days. Will recheck WBC in approximately two weeks and follow up as indicated.   2. Acute non-recurrent pansinusitis - azithromycin (ZITHROMAX) 250 MG tablet; z-pack - take as directed for 5 days for sinus infection.  Dispense: 6 tablet; Refill: 0  3. Thyroid disease Well managed thyoid disease. Continue levothyroxine as prescribed.  - levothyroxine (SYNTHROID) 88 MCG tablet; TAKE ONE TABLET BY MOUTH EVERY MORNING ON EMPTY STOMACH  Dispense: 30 tablet; Refill: 3  4. Generalized anxiety disorder May take valium 2mg  up to twice daily if needed for acute anxiety. New prescription sent to her pharmacy.  - diazepam (VALIUM) 2 MG tablet; Take 1 tablet (2 mg total) by mouth every 12 (twelve) hours as needed for anxiety.  Dispense: 45 tablet; Refill: 3  5. Nausea Associated with migraine headaches.  - promethazine (PHENERGAN) 25 MG tablet; Take 1 tablet (25 mg total) by mouth daily as needed for nausea or vomiting.  Dispense: 30 tablet; Refill: 3  General Counseling: Nellene verbalizes understanding of the findings of todays visit and agrees with plan of treatment. I have discussed any further diagnostic evaluation that may be needed or ordered today. We also reviewed her medications today. she has been encouraged to call the office with any questions or concerns that should arise related to  todays visit.   This patient was seen by Vincent GrosHeather Savahanna Almendariz FNP Collaboration with Dr Lyndon CodeFozia M Khan as a part of collaborative care agreement  Meds ordered this encounter  Medications  . azithromycin (ZITHROMAX) 250 MG tablet    Sig: z-pack - take as directed for 5 days for sinus infection.    Dispense:  6 tablet    Refill:  0    Order Specific Question:   Supervising Provider    Answer:   Lyndon CodeKHAN, FOZIA M [[id number]]  . levothyroxine (SYNTHROID) 88 MCG tablet    Sig: TAKE ONE TABLET BY MOUTH EVERY MORNING ON EMPTY STOMACH    Dispense:  30 tablet    Refill:  3    Order Specific Question:   Supervising Provider    Answer:   Lyndon CodeKHAN, FOZIA M [[id number]]  . diazepam (VALIUM) 2 MG tablet    Sig: Take 1 tablet (2 mg total) by mouth every 12 (twelve) hours as needed for anxiety.    Dispense:  45 tablet    Refill:  3    Patient may take 2 tablets at bedtime if needed for anxiety/insomnia    Order Specific Question:   Supervising Provider    Answer:   Lyndon CodeKHAN, FOZIA M [[id number]]  . promethazine (PHENERGAN) 25 MG tablet    Sig: Take 1 tablet (25 mg total) by mouth daily as needed for nausea or vomiting.    Dispense:  30 tablet    Refill:  3    Order Specific Question:   Supervising Provider    Answer:   Lyndon CodeKHAN, FOZIA M [[id number]]    Time spent: 2325 Minutes      Dr Lyndon CodeFozia M Khan Internal medicine

## 2019-02-25 NOTE — Progress Notes (Signed)
Reviewed with patient during visit

## 2019-05-27 ENCOUNTER — Other Ambulatory Visit: Payer: Self-pay

## 2019-05-27 ENCOUNTER — Ambulatory Visit
Admission: EM | Admit: 2019-05-27 | Discharge: 2019-05-27 | Disposition: A | Discharge status: Home / Self Care | Payer: BLUE CROSS/BLUE SHIELD | Attending: Family Medicine | Admitting: Family Medicine

## 2019-05-27 ENCOUNTER — Encounter: Payer: Self-pay | Admitting: Emergency Medicine

## 2019-05-27 DIAGNOSIS — Z20822 Contact with and (suspected) exposure to covid-19: Secondary | ICD-10-CM

## 2019-05-27 DIAGNOSIS — R05 Cough: Secondary | ICD-10-CM | POA: Diagnosis not present

## 2019-05-27 DIAGNOSIS — R0981 Nasal congestion: Secondary | ICD-10-CM

## 2019-05-27 DIAGNOSIS — Z20828 Contact with and (suspected) exposure to other viral communicable diseases: Secondary | ICD-10-CM

## 2019-05-27 NOTE — ED Triage Notes (Signed)
Pt c/o nasal congestion and dry cough. Started about 3 days ago. She states her husband recently tested positive for COVID.

## 2019-05-27 NOTE — Discharge Instructions (Signed)
Rest.   Fluids.  Test results available in 24-48 hours.  Take care  Dr. Lacinda Axon

## 2019-05-27 NOTE — ED Provider Notes (Signed)
[facility]    CSN: [id number] Arrival date & time: 05/27/19  1245   History   Chief Complaint Chief Complaint  Patient presents with  . Cough  . Nasal Congestion   HPI   38 year old female presents with the above complaints.  Patient reports that her husband recently tested positive for COVID-19.  She has had symptoms for the past 3 days.  She reports congestion and dry cough.  No fever.  Her son is also sick.  No other reported sick contacts.  No medications or interventions tried.  No known exacerbating factors.  No relieving factors.  No other complaints.  PMH, Surgical Hx, Family Hx, Social History reviewed and updated as below.  PMH: Patient Active Problem List   Diagnosis Date Noted  . Acute non-recurrent pansinusitis 02/25/2019  . Nausea 03/16/2018  . Generalized anxiety disorder 03/16/2018  . Inflammatory polyarthritis ([place]) 03/16/2018  . Vitamin D deficiency 03/16/2018  . Thyroid disease 08/26/2017  . Leukocytosis 08/26/2017  . Other fatigue 08/26/2017  . Abnormal liver function test 08/26/2017   Past Surgical History:  Procedure Laterality Date  . BREAST BIOPSY Left 07/08/2017  . NO PAST SURGERIES      OB History   No obstetric history on file.      Home Medications    Prior to Admission medications   Medication Sig Start Date End Date Taking? Authorizing Provider  diazepam (VALIUM) 2 MG tablet Take 1 tablet (2 mg total) by mouth every 12 (twelve) hours as needed for anxiety. 02/25/19  Yes Boscia, Greer Ee, NP  levothyroxine (SYNTHROID) 88 MCG tablet TAKE ONE TABLET BY MOUTH EVERY MORNING ON EMPTY STOMACH 02/25/19  Yes Boscia, Heather E, NP  promethazine (PHENERGAN) 25 MG tablet Take 1 tablet (25 mg total) by mouth daily as needed for nausea or vomiting. 02/25/19  Yes Ronnell Freshwater, NP    Family History Family History  Problem Relation Age of Onset  . Breast cancer Mother 81  . Atrial fibrillation Father   . Breast cancer Maternal  Aunt     Social History Social History   Tobacco Use  . Smoking status: Never Smoker  . Smokeless tobacco: Never Used  Substance Use Topics  . Alcohol use: No    Alcohol/week: 0.0 standard drinks  . Drug use: No     Allergies   Zofran [ondansetron] and Sulfa antibiotics   Review of Systems Review of Systems  Constitutional: Negative for fever.  HENT: Positive for congestion.   Respiratory: Positive for cough.    Physical Exam Triage Vital Signs ED Triage Vitals  Enc Vitals Group     BP 05/27/19 1330 (!) 129/96     Pulse Rate 05/27/19 1330 (!) 123     Resp 05/27/19 1330 18     Temp 05/27/19 1330 98.3 F (36.8 C)     Temp Source 05/27/19 1330 Oral     SpO2 05/27/19 1330 97 %     Weight 05/27/19 1325 157 lb (71.2 kg)     Height 05/27/19 1325 5\' 3"  (1.6 m)     Head Circumference --      Peak Flow --      Pain Score 05/27/19 1325 0     Pain Loc --      Pain Edu? --      Excl. in [place]? --    Updated Vital Signs BP (!) 129/96 (BP Location: Left Arm)   Pulse (!) 123   Temp 98.3 F (36.8  C) (Oral)   Resp 18   Ht 5\' 3"  (1.6 m)   Wt 71.2 kg   LMP 05/13/2019 (Approximate)   SpO2 97%   BMI 27.81 kg/m   Visual Acuity Right Eye Distance:   Left Eye Distance:   Bilateral Distance:    Right Eye Near:   Left Eye Near:    Bilateral Near:     Physical Exam Vitals signs and nursing note reviewed.  Constitutional:      General: She is not in acute distress.    Appearance: Normal appearance. She is not ill-appearing.  HENT:     Head: Normocephalic and atraumatic.  Eyes:     General:        Right eye: No discharge.        Left eye: No discharge.     Conjunctiva/sclera: Conjunctivae normal.  Cardiovascular:     Rate and Rhythm: Normal rate and regular rhythm.     Heart sounds: No murmur.  Pulmonary:     Effort: Pulmonary effort is normal.     Breath sounds: Normal breath sounds. No wheezing, rhonchi or rales.  Neurological:     Mental Status: She is alert.   Psychiatric:        Mood and Affect: Mood normal.        Behavior: Behavior normal.    UC Treatments / Results  Labs (all labs ordered are listed, but only abnormal results are displayed) Labs Reviewed  NOVEL CORONAVIRUS, NAA (HOSP ORDER, SEND-OUT TO REF LAB; TAT 18-24 HRS)    EKG   Radiology No results found.  Procedures Procedures (including critical care time)  Medications Ordered in UC Medications - No data to display  Initial Impression / Assessment and Plan / UC Course  I have reviewed the triage vital signs and the nursing notes.  Pertinent labs & imaging results that were available during my care of the patient were reviewed by me and considered in my medical decision making (see chart for details).    38 year old female presents with respiratory symptoms in the setting of Covid exposure.  Acute uncomplicated illness.  She is doing well at this time.  Awaiting Covid test results.  Advised symptomatic care with rest and fluids.  Final Clinical Impressions(s) / UC Diagnoses   Final diagnoses:  Exposure to COVID-19 virus  Encounter for laboratory testing for COVID-19 virus     Discharge Instructions     Rest.   Fluids.  Test results available in 24-48 hours.  Take care  Dr. 05-13-1980    ED Prescriptions    None     PDMP not reviewed this encounter.   Adriana Simas, Tommie Sams 05/27/19 1450

## 2019-05-28 ENCOUNTER — Telehealth: Payer: Self-pay | Admitting: Emergency Medicine

## 2019-05-28 LAB — NOVEL CORONAVIRUS, NAA (HOSP ORDER, SEND-OUT TO REF LAB; TAT 18-24 HRS): SARS-CoV-2, NAA: DETECTED — AB

## 2019-05-28 NOTE — Telephone Encounter (Signed)
Patient contacted by phone and made aware of positive   results. Pt verbalized understanding and had all questions answered.   

## 2019-05-28 NOTE — Telephone Encounter (Signed)

## 2019-06-18 ENCOUNTER — Telehealth: Payer: Self-pay

## 2019-06-18 NOTE — Telephone Encounter (Signed)
LMOM FOR PT TO RS  06-23-19 APPT DUE TO COVID + AS WELL AS BEING EXPOSED TO COVID.

## 2019-06-22 ENCOUNTER — Telehealth: Payer: Self-pay

## 2019-06-22 NOTE — Telephone Encounter (Signed)
Rescheduled appointment on 06/23/2019 to 07/20/2019. klh

## 2019-06-23 ENCOUNTER — Other Ambulatory Visit: Payer: BLUE CROSS/BLUE SHIELD | Admitting: Nurse Practitioner

## 2019-07-16 ENCOUNTER — Telehealth: Payer: Self-pay

## 2019-07-16 NOTE — Telephone Encounter (Signed)
LMOM FOR PATIENT TO CONFIRM AND SCREEN FOR 07-20-19 OV. 

## 2019-07-20 ENCOUNTER — Other Ambulatory Visit: Payer: BLUE CROSS/BLUE SHIELD | Admitting: Nurse Practitioner

## 2019-07-20 ENCOUNTER — Other Ambulatory Visit: Payer: Self-pay

## 2019-07-20 DIAGNOSIS — E079 Disorder of thyroid, unspecified: Secondary | ICD-10-CM

## 2019-07-20 MED ORDER — LEVOTHYROXINE SODIUM 88 MCG PO TABS: ORAL_TABLET | ORAL | 3 refills | Status: AC

## 2019-07-24 ENCOUNTER — Telehealth: Payer: Self-pay

## 2019-07-24 NOTE — Telephone Encounter (Signed)
BILLED PATIENT MISSED APPT FEE 07/20/2019. °

## 2019-08-05 ENCOUNTER — Telehealth: Payer: Self-pay

## 2019-08-05 NOTE — Telephone Encounter (Signed)
Called lmom informing patient to reschedule physical that was a missed appointment in 06/2019. klh

## 2019-11-14 IMAGING — MG MM DIGITAL DIAGNOSTIC UNILAT*L* W/ TOMO W/ CAD
6 series · 6 of 14 positions shown · non-contrast
Comparison: Previous exam(s).

CLINICAL DATA: Follow-up of probably benign left breast 330 o'clock
mass.

EXAM:
2D DIGITAL DIAGNOSTIC LEFT MAMMOGRAM WITH CAD AND ADJUNCT TOMO
ULTRASOUND LEFT BREAST

[L CC synth-2D]
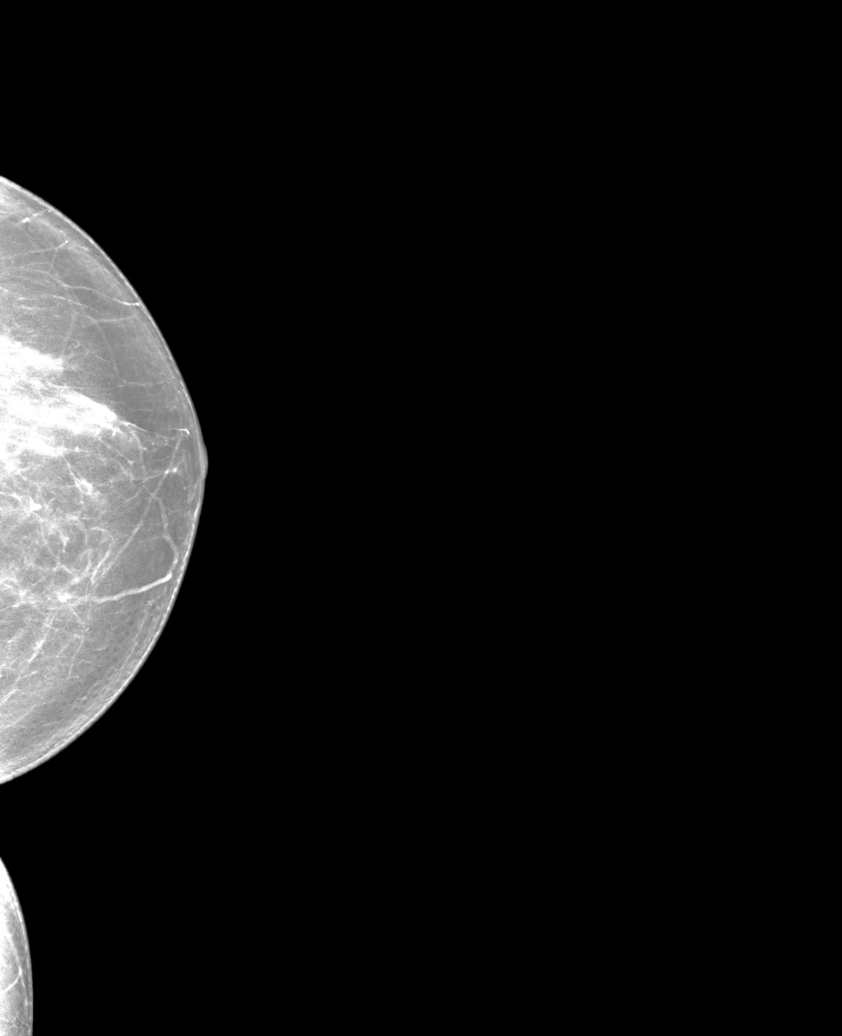

[L CC]
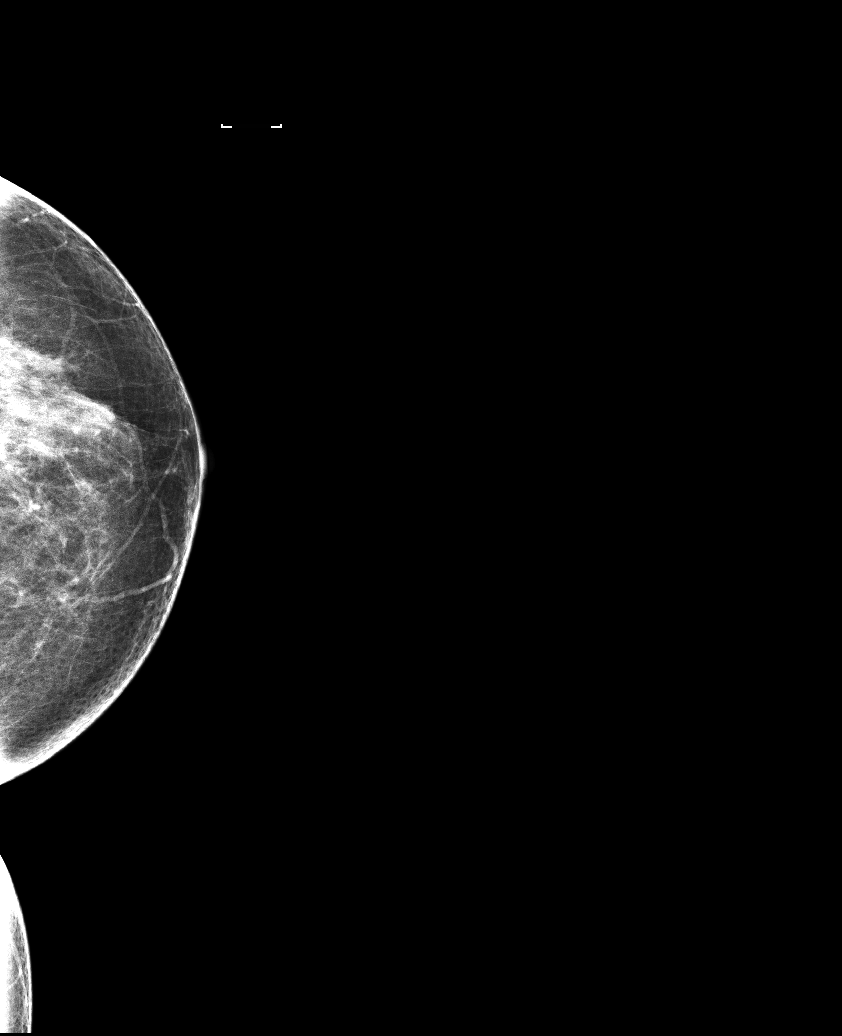

[L MLO synth-2D]
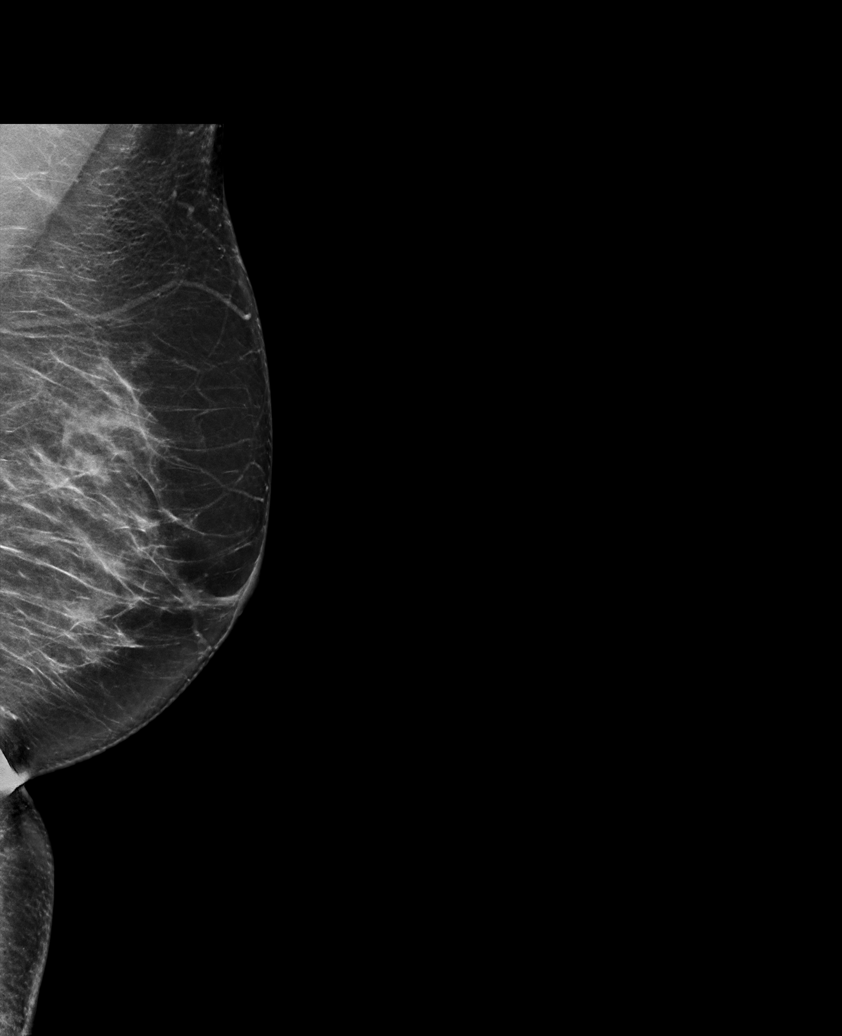

[L MLO]
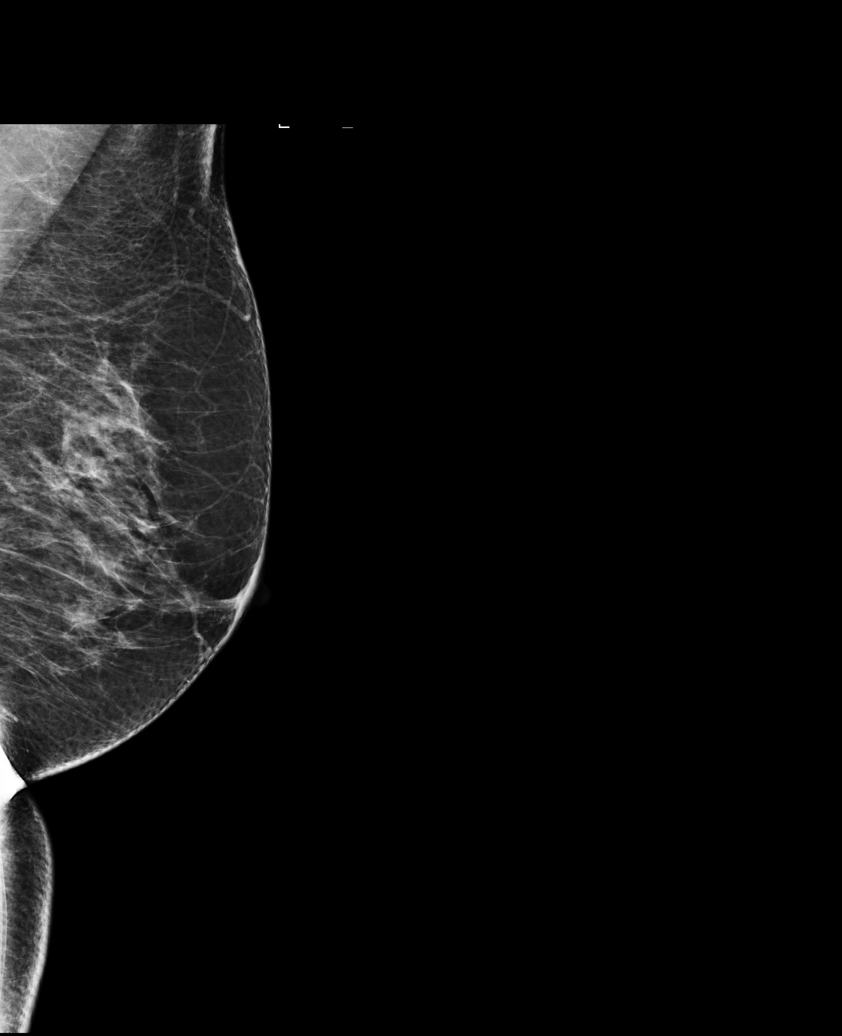

[L CC tomo · tomo slice 33/64.0]
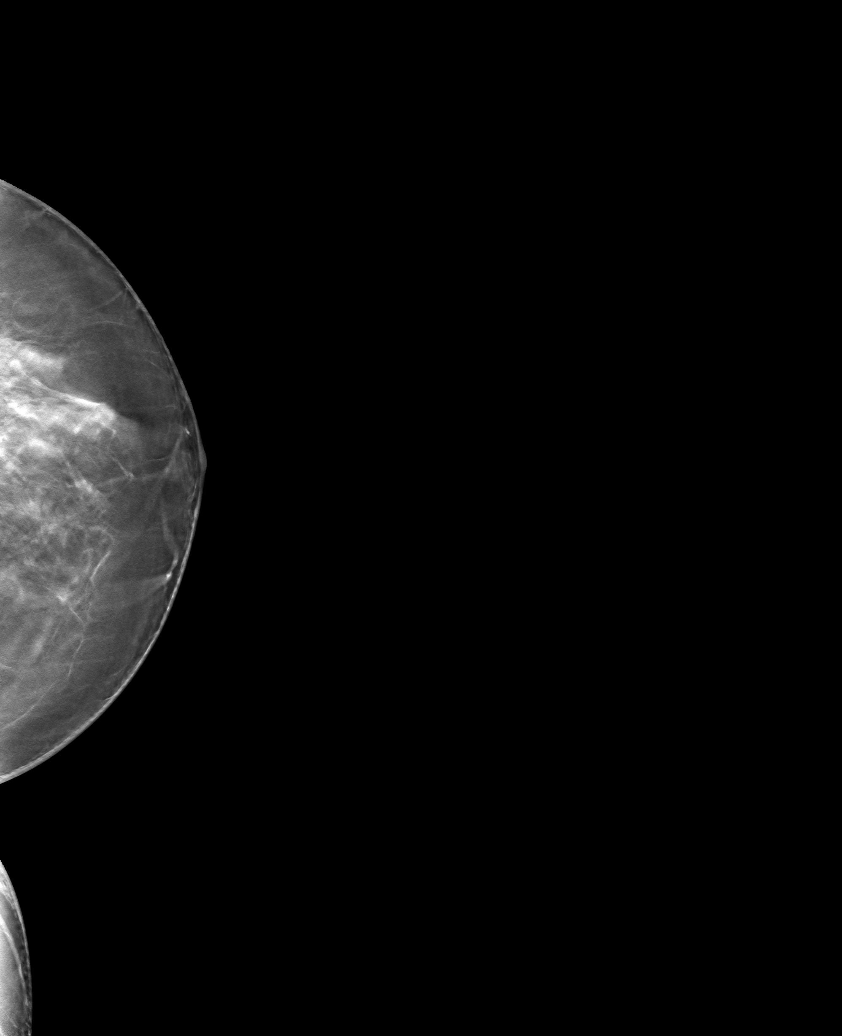

[L MLO tomo · tomo slice 39/76.0]
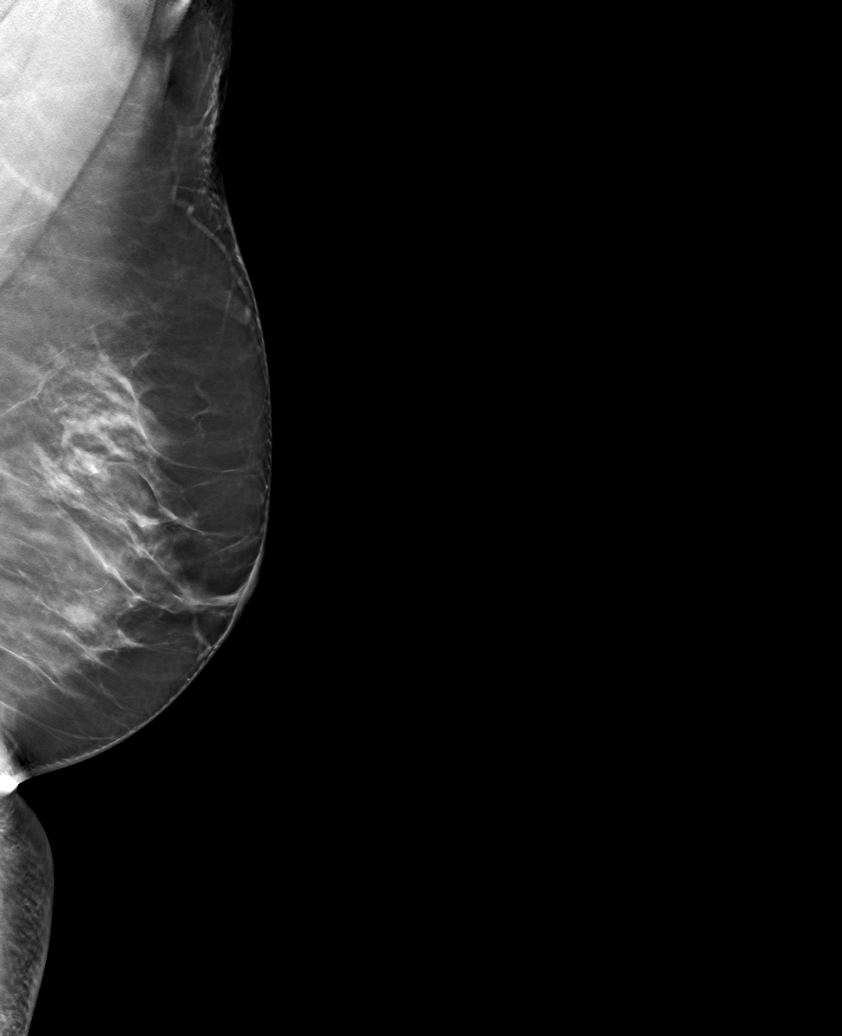

[6 of 14 positions shown; findings below may reference images not displayed]

ACR Breast Density Category c: The breast tissue is heterogeneously
dense, which may obscure small masses.
FINDINGS: Mammographically, there is a persistent low-density nodule in the
left lateral breast, middle depth.

Mammographic images were processed with CAD.

On physical exam, no suspicious masses are palpated.

Targeted ultrasound is performed, showing left breast in 03:30
o'clock 1 cm from the nipple hypoechoic mass measuring 0.6 x 0.4 x
0.6 cm. Smaller nodules are seen in the immediately adjacent breast
parenchyma. Minimally prominent duct terminates within this group of
nodules.

Ultrasound examination of the left axilla demonstrates 2 prominent
lymph nodes with symmetric cortical thickening of up to 4 mm.
IMPRESSION: Left breast 330 o'clock cluster of indeterminate nodules, for which
ultrasound-guided core needle biopsy is recommended.

Two indeterminate left axillary lymph nodes.

RECOMMENDATION:
Ultrasound-guided core needle biopsy of the left breast 3:30 o'clock
cluster of nodules.

The further management of the 2 left axillary lymph nodes should be
based on the pathology results. With benign pathology results,
six-month follow-up with left axillary ultrasound is recommended.
Otherwise, ultrasound-guided core needle biopsy of 1 of the enlarged
lymph nodes should be performed.

I have discussed the findings and recommendations with the patient.
Results were also provided in writing at the conclusion of the
visit. If applicable, a reminder letter will be sent to the patient
regarding the next appointment.

BI-RADS CATEGORY  4: Suspicious.

## 2020-04-05 ENCOUNTER — Other Ambulatory Visit: Payer: Self-pay
